# Patient Record
Sex: Female | Born: 1965 | Race: White | Hispanic: No | Marital: Single | Smoking: Former smoker
Health system: Southern US, Community
[De-identification: ages and names within clinical notes are randomized; demographics above are authoritative.]

## PROBLEM LIST (undated history)

## (undated) DIAGNOSIS — J189 Pneumonia, unspecified organism: Secondary | ICD-10-CM

## (undated) DIAGNOSIS — M1990 Unspecified osteoarthritis, unspecified site: Secondary | ICD-10-CM

## (undated) DIAGNOSIS — E669 Obesity, unspecified: Secondary | ICD-10-CM

## (undated) DIAGNOSIS — J45909 Unspecified asthma, uncomplicated: Secondary | ICD-10-CM

## (undated) HISTORY — DX: Unspecified osteoarthritis, unspecified site: M19.90

## (undated) HISTORY — PX: JOINT REPLACEMENT: SHX530

## (undated) HISTORY — PX: TONSILLECTOMY: SUR1361

---

## 2000-12-18 ENCOUNTER — Encounter: Payer: Self-pay | Admitting: Obstetrics

## 2000-12-18 ENCOUNTER — Inpatient Hospital Stay (HOSPITAL_COMMUNITY): Admission: AD | Admit: 2000-12-18 | Discharge: 2000-12-18 | Payer: Self-pay | Admitting: Obstetrics

## 2001-03-24 ENCOUNTER — Ambulatory Visit (HOSPITAL_COMMUNITY): Admission: RE | Admit: 2001-03-24 | Discharge: 2001-03-24 | Payer: Self-pay | Admitting: Obstetrics

## 2001-03-24 ENCOUNTER — Encounter: Payer: Self-pay | Admitting: Obstetrics

## 2001-05-03 ENCOUNTER — Inpatient Hospital Stay (HOSPITAL_COMMUNITY): Admission: AD | Admit: 2001-05-03 | Discharge: 2001-05-03 | Payer: Self-pay | Admitting: Obstetrics

## 2001-05-09 ENCOUNTER — Observation Stay (HOSPITAL_COMMUNITY): Admission: AD | Admit: 2001-05-09 | Discharge: 2001-05-09 | Payer: Self-pay | Admitting: Obstetrics

## 2001-05-14 ENCOUNTER — Inpatient Hospital Stay (HOSPITAL_COMMUNITY): Admission: AD | Admit: 2001-05-14 | Discharge: 2001-05-17 | Payer: Self-pay | Admitting: Obstetrics

## 2006-04-22 ENCOUNTER — Inpatient Hospital Stay (HOSPITAL_COMMUNITY): Admission: AD | Admit: 2006-04-22 | Discharge: 2006-04-22 | Payer: Self-pay | Admitting: Gynecology

## 2013-10-03 ENCOUNTER — Emergency Department: Payer: Self-pay | Admitting: Emergency Medicine

## 2013-11-06 ENCOUNTER — Other Ambulatory Visit (HOSPITAL_COMMUNITY)
Admission: RE | Admit: 2013-11-06 | Discharge: 2013-11-06 | Disposition: A | Discharge status: Home / Self Care | Payer: BC Managed Care – PPO | Source: Ambulatory Visit | Attending: Obstetrics & Gynecology | Admitting: Obstetrics & Gynecology

## 2013-11-06 ENCOUNTER — Other Ambulatory Visit: Payer: Self-pay | Admitting: Obstetrics & Gynecology

## 2013-11-06 ENCOUNTER — Other Ambulatory Visit (HOSPITAL_COMMUNITY): Payer: Self-pay | Admitting: Obstetrics & Gynecology

## 2013-11-06 DIAGNOSIS — Z1231 Encounter for screening mammogram for malignant neoplasm of breast: Secondary | ICD-10-CM

## 2013-11-06 DIAGNOSIS — Z1151 Encounter for screening for human papillomavirus (HPV): Secondary | ICD-10-CM | POA: Insufficient documentation

## 2013-11-06 DIAGNOSIS — Z124 Encounter for screening for malignant neoplasm of cervix: Secondary | ICD-10-CM | POA: Insufficient documentation

## 2013-11-09 LAB — CYTOLOGY - PAP

## 2013-11-26 ENCOUNTER — Ambulatory Visit (HOSPITAL_COMMUNITY)
Admission: RE | Admit: 2013-11-26 | Discharge: 2013-11-26 | Disposition: A | Discharge status: Home / Self Care | Payer: BC Managed Care – PPO | Source: Ambulatory Visit | Attending: Obstetrics & Gynecology | Admitting: Obstetrics & Gynecology

## 2013-11-26 ENCOUNTER — Ambulatory Visit (HOSPITAL_COMMUNITY): Payer: Self-pay

## 2013-11-26 DIAGNOSIS — Z1231 Encounter for screening mammogram for malignant neoplasm of breast: Secondary | ICD-10-CM

## 2013-12-02 ENCOUNTER — Other Ambulatory Visit: Payer: Self-pay | Admitting: Obstetrics & Gynecology

## 2013-12-02 DIAGNOSIS — R928 Other abnormal and inconclusive findings on diagnostic imaging of breast: Secondary | ICD-10-CM

## 2013-12-15 ENCOUNTER — Ambulatory Visit
Admission: RE | Admit: 2013-12-15 | Discharge: 2013-12-15 | Disposition: A | Discharge status: Home / Self Care | Payer: BC Managed Care – PPO | Source: Ambulatory Visit | Attending: Obstetrics & Gynecology | Admitting: Obstetrics & Gynecology

## 2013-12-15 ENCOUNTER — Other Ambulatory Visit: Payer: Self-pay | Admitting: Obstetrics & Gynecology

## 2013-12-15 DIAGNOSIS — R928 Other abnormal and inconclusive findings on diagnostic imaging of breast: Secondary | ICD-10-CM

## 2014-06-29 ENCOUNTER — Emergency Department (HOSPITAL_COMMUNITY): Payer: Self-pay

## 2014-06-29 ENCOUNTER — Emergency Department (HOSPITAL_COMMUNITY)
Admission: EM | Admit: 2014-06-29 | Discharge: 2014-06-29 | Discharge status: Against Medical Advice | Payer: Self-pay | Attending: Emergency Medicine | Admitting: Emergency Medicine

## 2014-06-29 ENCOUNTER — Encounter (HOSPITAL_COMMUNITY): Payer: Self-pay | Admitting: Emergency Medicine

## 2014-06-29 ENCOUNTER — Emergency Department (HOSPITAL_COMMUNITY)
Admission: EM | Admit: 2014-06-29 | Discharge: 2014-06-30 | Disposition: A | Discharge status: Home / Self Care | Payer: Self-pay | Attending: Emergency Medicine | Admitting: Emergency Medicine

## 2014-06-29 DIAGNOSIS — R091 Pleurisy: Secondary | ICD-10-CM | POA: Insufficient documentation

## 2014-06-29 DIAGNOSIS — Z8701 Personal history of pneumonia (recurrent): Secondary | ICD-10-CM | POA: Insufficient documentation

## 2014-06-29 DIAGNOSIS — J45909 Unspecified asthma, uncomplicated: Secondary | ICD-10-CM | POA: Insufficient documentation

## 2014-06-29 DIAGNOSIS — E669 Obesity, unspecified: Secondary | ICD-10-CM | POA: Insufficient documentation

## 2014-06-29 DIAGNOSIS — Z87891 Personal history of nicotine dependence: Secondary | ICD-10-CM | POA: Insufficient documentation

## 2014-06-29 DIAGNOSIS — R079 Chest pain, unspecified: Secondary | ICD-10-CM | POA: Insufficient documentation

## 2014-06-29 DIAGNOSIS — J45901 Unspecified asthma with (acute) exacerbation: Secondary | ICD-10-CM | POA: Insufficient documentation

## 2014-06-29 DIAGNOSIS — R918 Other nonspecific abnormal finding of lung field: Secondary | ICD-10-CM | POA: Insufficient documentation

## 2014-06-29 HISTORY — DX: Obesity, unspecified: E66.9

## 2014-06-29 HISTORY — DX: Unspecified asthma, uncomplicated: J45.909

## 2014-06-29 HISTORY — DX: Pneumonia, unspecified organism: J18.9

## 2014-06-29 LAB — BASIC METABOLIC PANEL
Anion gap: 8 (ref 5–15)
Anion gap: 8 (ref 5–15)
BUN: 9 mg/dL (ref 6–23)
BUN: 10 mg/dL (ref 6–23)
CO2: 28 mmol/L (ref 19–32)
CO2: 25 mmol/L (ref 19–32)
Calcium: 9.3 mg/dL (ref 8.4–10.5)
Calcium: 9.2 mg/dL (ref 8.4–10.5)
Chloride: 103 mmol/L (ref 96–112)
Chloride: 101 mmol/L (ref 96–112)
Creatinine, Ser: 0.9 mg/dL (ref 0.50–1.10)
Creatinine, Ser: 0.95 mg/dL (ref 0.50–1.10)
GFR calc Af Amer: 88 mL/min — ABNORMAL LOW (ref >90)
GFR calc Af Amer: 82 mL/min — ABNORMAL LOW (ref >90)
GFR calc non Af Amer: 76 mL/min — ABNORMAL LOW (ref >90)
GFR calc non Af Amer: 71 mL/min — ABNORMAL LOW (ref >90)
Glucose, Bld: 107 mg/dL — ABNORMAL HIGH (ref 70–99)
Glucose, Bld: 124 mg/dL — ABNORMAL HIGH (ref 70–99)
Potassium: 4.2 mmol/L (ref 3.5–5.1)
Potassium: 3.8 mmol/L (ref 3.5–5.1)
Sodium: 139 mmol/L (ref 135–145)
Sodium: 134 mmol/L — ABNORMAL LOW (ref 135–145)

## 2014-06-29 LAB — CBC
HCT: 37.6 % (ref 36.0–46.0)
HCT: 35.9 % — ABNORMAL LOW (ref 36.0–46.0)
Hemoglobin: 12.6 g/dL (ref 12.0–15.0)
Hemoglobin: 12.4 g/dL (ref 12.0–15.0)
MCH: 28.9 pg (ref 26.0–34.0)
MCH: 29 pg (ref 26.0–34.0)
MCHC: 33.5 g/dL (ref 30.0–36.0)
MCHC: 34.5 g/dL (ref 30.0–36.0)
MCV: 86.2 fL (ref 78.0–100.0)
MCV: 84.1 fL (ref 78.0–100.0)
Platelets: 293 10*3/uL (ref 150–400)
Platelets: 282 10*3/uL (ref 150–400)
RBC: 4.36 MIL/uL (ref 3.87–5.11)
RBC: 4.27 MIL/uL (ref 3.87–5.11)
RDW: 13 % (ref 11.5–15.5)
RDW: 13.1 % (ref 11.5–15.5)
WBC: 7.4 10*3/uL (ref 4.0–10.5)
WBC: 7.3 10*3/uL (ref 4.0–10.5)

## 2014-06-29 LAB — I-STAT TROPONIN, ED
Troponin i, poc: 0 ng/mL (ref 0.00–0.08)
Troponin i, poc: 0 ng/mL (ref 0.00–0.08)

## 2014-06-29 LAB — BRAIN NATRIURETIC PEPTIDE: B Natriuretic Peptide: 47.9 pg/mL (ref 0.0–100.0)

## 2014-06-29 NOTE — ED Notes (Signed)
Pt. reports right chest pain for 1 1/2 weeks with SOB , denies nausea or diaphoresis , no cough or congestion , currently taking medications for pneumonia .

## 2014-06-29 NOTE — ED Provider Notes (Signed)
CSN: [id number]     Arrival date & time 06/29/14  2306 History  This chart was scribed for Connie Co, MD by Richarda Overlie, ED Scribe. This patient was seen in room A06C/A06C and the patient's care was started 12:15 AM.    Chief Complaint  Patient presents with  . Chest Pain   The history is provided by the patient. No language interpreter was used.   HPI Comments: Connie Craig is a 49 y.o. female with a history of asthma and pneumonia who presents to the Emergency Department complaining of a stabbing, right sided CP that started 8 days ago. She reports associated SOB. Pt reports that her pain started before she went to sleep and when she woke up she still was experiencing the CP. Pt reports she had pleurisy multiple years ago and had leftover medication that she took. Pt says that her pleurisy medication provided some relief to her symptoms. She states that she went to the [facility] 4 days ago and received a CT scan, labs and x ray.  Pt says that she was treated for pneumonia and is taking 2,000mg  Abx daily. She reports she has taking multiple antacids for possible GERD which has failed to provide her any relief. Pt states her pain is worse at night when she is about to lay down. She reports her pain is worse with certain movements and positions. Pt reports she has gained approximately 30lbs in the last 5 months. Pt reports a history of smoking but says she quit 3 years ago.   Past Medical History  Diagnosis Date  . Asthma   . Pneumonia   . Obesity    Past Surgical History  Procedure Laterality Date  . Tonsillectomy     No family history on file. History  Substance Use Topics  . Smoking status: Former Games developer  . Smokeless tobacco: Not on file  . Alcohol Use: No   OB History    No data available     Review of Systems  A complete 10 system review of systems was obtained and all systems are negative except as noted in the HPI and PMH.    Allergies  Review of  patient's allergies indicates no known allergies.  Home Medications   Prior to Admission medications   Not on File   BP 117/56 mmHg  Pulse 83  Temp(Src) 98.3 F (36.8 C) (Oral)  Resp 20  SpO2 98%  LMP 06/14/2014 Physical Exam  Constitutional: She is oriented to person, place, and time. She appears well-developed and well-nourished. No distress.  HENT:  Head: Normocephalic and atraumatic.  Eyes: EOM are normal.  Neck: Normal range of motion.  Cardiovascular: Normal rate, regular rhythm and normal heart sounds.   Pulmonary/Chest: Effort normal and breath sounds normal.  Abdominal: Soft. She exhibits no distension. There is no tenderness.  Musculoskeletal: Normal range of motion.  Neurological: She is alert and oriented to person, place, and time.  Skin: Skin is warm and dry.  Psychiatric: She has a normal mood and affect. Judgment normal.  Nursing note and vitals reviewed.   ED Course  Procedures   DIAGNOSTIC STUDIES: Oxygen Saturation is 98% on RA, normal by my interpretation.    COORDINATION OF CARE: 12:29 AM Discussed treatment plan with pt at bedside and pt agreed to plan.   Labs Review Labs Reviewed  CBC - Abnormal; Notable for the following:    HCT 35.9 (*)    All other components within normal limits  BASIC METABOLIC PANEL - Abnormal; Notable for the following:    Sodium 134 (*)    Glucose, Bld 124 (*)    GFR calc non Af Amer 71 (*)    GFR calc Af Amer 82 (*)    All other components within normal limits  BRAIN NATRIURETIC PEPTIDE  I-STAT TROPOININ, ED    Imaging Review Dg Chest 2 View  06/29/2014   CLINICAL DATA:  Acute onset of right-sided chest pain. Initial encounter.  EXAM: CHEST  2 VIEW  COMPARISON:  Chest radiograph performed earlier today at 7:21 p.m.  FINDINGS: The lungs are well-aerated. Persistent right basilar airspace opacity most likely reflects pneumonia, though as before, a mass cannot be excluded. There is no evidence of pleural effusion or  pneumothorax.  The heart is normal in size; the mediastinal contour is within normal limits. No acute osseous abnormalities are seen.  IMPRESSION: Persistent right basilar airspace opacity most likely reflects pneumonia, though as before, a mass cannot be excluded. Would correlate with the patient's symptoms, treat for pneumonia if clinically appropriate, and perform follow-up imaging in several weeks after completion of treatment. Or if the patient has no symptoms of pneumonia or lab values to suggest infection, biopsy could be considered.   Electronically Signed   By: Roanna RaiderJeffery  Chang M.D.   On: 06/29/2014 23:51   Dg Chest 2 View  06/29/2014   CLINICAL DATA:  Right side chest pain for 2 weeks  EXAM: CHEST  2 VIEW  COMPARISON:  CTA chest same day  FINDINGS: Cardiomediastinal silhouette is stable. Again noted focal consolidation/infiltrate in right cardiophrenic angle. No pulmonary edema. No pneumothorax.  IMPRESSION: No pulmonary edema or pneumothorax. Again noted focal consolidation in right cardiophrenic angle.   Electronically Signed   By: Natasha MeadLiviu  Pop M.D.   On: 06/29/2014 19:56  I personally reviewed the imaging tests through PACS system I reviewed available ER/hospitalization records through the EMR     MDM   Final diagnoses:  Pleurisy  Abnormal CT scan of lung   Patient symptoms are consistent with right-sided pleurisy.  CT scan from January 21 are reviewed and she does appear to have a focal area of either consolidation or mass at the right lower lateral aspect of the lung.  This likely is inflammatory her pleura which is causing her pleuritic right-sided pain.  At that time she is negative for pulmonary embolism.  I'm concerned this could represent a pleural-based mass which is resulting in pleurisy.  This will likely need biopsy.  I referred her to the pulmonology team for further evaluation.  She will need to complete her course of antibiotics and have repeat imaging at that time.  If the  mass resolves this is likely focal pneumonia however symptoms are not really consistent with that.  I suspect biopsy is best warranted in this patient with prior tobacco abuse.Marland Kitchen. Home with anti-inflammatories and oxycodone. Ecg earlier tonight from different [place] [facility] reviewed   I personally performed the services described in this documentation, which was scribed in my presence. The recorded information has been reviewed and is accurate.        Connie CoKevin M Janaysha Depaulo, MD 06/30/14 [phone]

## 2014-06-29 NOTE — ED Notes (Signed)
Spoke to pt at the registration window per registration's request.  Pt states that she needs O2 and that she has a collapsed lung.  Explained to pt reason for delay.  Pt was cursing while talking to this staff.  Asked pt to stop cursing.  Pt states "just take this" and handed this nurse her hospital gown.

## 2014-06-29 NOTE — ED Notes (Signed)
Pt c/o stabbing chest pain and SOB onset 2 weeks ago, [facility] states they found a small opacity on [facility] lung[facility] her with Abx for pneumonia, states things have gotten worse.

## 2014-06-30 LAB — BRAIN NATRIURETIC PEPTIDE: B Natriuretic Peptide: 41.2 pg/mL (ref 0.0–100.0)

## 2014-06-30 MED ORDER — OXYCODONE-ACETAMINOPHEN 5-325 MG PO TABS: 1.0000 | ORAL_TABLET | ORAL | Status: DC | PRN

## 2014-06-30 MED ORDER — KETOROLAC TROMETHAMINE 30 MG/ML IJ SOLN
30.0000 mg | Freq: Once | INTRAMUSCULAR | Status: AC
  Administered 2014-06-30: 30 mg via INTRAVENOUS
  Filled 2014-06-30: qty 1

## 2014-06-30 MED ORDER — IBUPROFEN 600 MG PO TABS: 600.0000 mg | ORAL_TABLET | Freq: Three times a day (TID) | ORAL | Status: DC | PRN

## 2014-06-30 NOTE — ED Notes (Signed)
Pt a/o x 4 on d/c with steady gait. 

## 2014-06-30 NOTE — Discharge Instructions (Signed)

## 2014-07-12 ENCOUNTER — Ambulatory Visit (INDEPENDENT_AMBULATORY_CARE_PROVIDER_SITE_OTHER): Payer: 59 | Admitting: Internal Medicine

## 2014-07-12 ENCOUNTER — Encounter: Payer: Self-pay | Admitting: Internal Medicine

## 2014-07-12 VITALS — BP 102/66 | HR 76 | Ht 63.5 in | Wt 223.4 lb

## 2014-07-12 DIAGNOSIS — R918 Other nonspecific abnormal finding of lung field: Secondary | ICD-10-CM

## 2014-07-12 MED ORDER — PREDNISONE 10 MG PO TABS: ORAL_TABLET | ORAL | Status: DC

## 2014-07-12 MED ORDER — PANTOPRAZOLE SODIUM 40 MG PO TBEC: 40.0000 mg | DELAYED_RELEASE_TABLET | Freq: Every day | ORAL | Status: DC

## 2014-07-12 MED ORDER — FAMOTIDINE 20 MG PO TABS: ORAL_TABLET | ORAL | Status: DC

## 2014-07-12 NOTE — Patient Instructions (Addendum)
Prednisone 10 mg twice daily until pain in joints and chest are gone then one daily with bfast   Pantoprazole (protonix) 40 mg   Take 30-60 min before first meal of the day and Pepcid 20 mg one bedtime until return to office - this is the best way to tell whether stomach acid is contributing to your problem.    GERD (REFLUX)  is an extremely common cause of respiratory symptoms just like yours , many times with no obvious heartburn at all.    It can be treated with medication, but also with lifestyle changes including avoidance of late meals, excessive alcohol, smoking cessation, and avoid fatty foods, chocolate, peppermint, colas, red wine, and acidic juices such as orange juice.  NO MINT OR MENTHOL PRODUCTS SO NO COUGH DROPS  USE SUGARLESS CANDY INSTEAD (Jolley ranchers or Stover's or Life Savers) or even ice chips will also do - the key is to swallow to prevent all throat clearing. NO OIL BASED VITAMINS - use powdered substitutes.   Please schedule a follow up office visit in 4 weeks, sooner if needed with cxr on return

## 2014-07-12 NOTE — Progress Notes (Signed)
Subjective:    Patient ID: Connie Craig, female    DOB: 01/16/1966,     MRN: [id number]  HPI   [age] yowf with RA / followed by Connie Craig  quit smoking 2012 @ 180 lb and at that point cc   bad bronchitis completely  resolved and then  Abruptly developed  "pleurisy" LAnt around 2014 resolved but doesn't remember what she took  then abrupt R ant pleuritic cp 3rd week Jan 2016 > [facility] neg pe suggestive of pna rec pna rx abx  But no better  > [facility] 06/29/14 more abx >  then [facility] prednisone much better along with RA complaints > referred to [facility] 07/12/14 by Dr Manus GunningEhinger.    07/12/2014 1st Connie Craig office visit/ Connie Craig   Chief Complaint  Patient presents with  . Craig Consult    [facility] for [facility] abnormal ct chest. Pt c/o SOB and CP for the past month. Symptoms come and go.   abrupt onset symptoms doe x fast walk since abrupt onset of cp  in  mid  jan 2016  s cough/ fever/ purulent sputum and all symptoms did not respond to multiple abx then improved on pred, worse off, as did her RA symptoms.  Pain is down to a 1-2 / 10 whereas was closer to 8 at worst, some better with nsaids Pain is most severe upper R ant chest just lateral to sternum but radiates to R flank.  No obvious other patterns in day to day or daytime variabilty or assoc chronic cough or chest tightness, subjective wheeze overt sinus or hb symptoms. No unusual exp hx or h/o childhood pna/ asthma or knowledge of premature birth.  Sleeping ok without nocturnal  or early am exacerbation  of respiratory  c/o's or need for noct saba. Also denies any obvious fluctuation of symptoms with weather or environmental changes or other aggravating or alleviating factors except as outlined above   Current Medications, Allergies, Complete Past Medical History, Past Surgical History, Family History, and Social History were reviewed in [facility]  electronic [facility].           Review of Systems  Constitutional: Negative for fever, chills and unexpected weight change.  HENT: Negative for congestion, dental problem, ear pain, nosebleeds, postnasal drip, rhinorrhea, sinus pressure, sneezing, sore throat, trouble swallowing and voice change.   Eyes: Negative for visual disturbance.  Respiratory: Positive for shortness of breath. Negative for cough and choking.   Cardiovascular: Positive for chest pain. Negative for leg swelling.  Gastrointestinal: Negative for vomiting, abdominal pain and diarrhea.  Genitourinary: Negative for difficulty urinating.  Musculoskeletal: Positive for arthralgias.  Skin: Negative for rash.  Neurological: Positive for headaches. Negative for tremors and syncope.  Hematological: Does not bruise/bleed easily.       Objective:   Physical Exam   amb obese wm nad  Wt Readings from Last 3 Encounters:  07/12/14 223 lb 6.4 oz (101.334 kg)    Vital signs reviewed   HEENT: nl dentition, turbinates, and orophanx. Nl external ear canals without cough reflex   NECK :  without JVD/Nodes/TM/ nl carotid upstrokes bilaterally   LUNGS: no acc muscle use, clear to A and P bilaterally without cough on insp or exp maneuvers   CV:  RRR  no s3 or murmur or increase in P2, no edema   ABD:  soft  and nontender with nl excursion in the supine position. No bruits or organomegaly, bowel sounds nl  MS:  warm without deformities, calf tenderness, cyanosis or clubbing  SKIN: warm and dry without lesions    NEURO:  alert, approp, no deficits     CXR:  06/29/14  I personally reviewed images and agree with radiology impression as follows:    Persistent right basilar airspace opacity most likely reflects pneumonia, though as before, a mass cannot be excluded. Would correlate with the patient's symptoms, treat for pneumonia if clinically appropriate, and perform follow-up imaging in several weeks after  completion of treatment.    Assessment & Plan:

## 2014-07-13 ENCOUNTER — Encounter: Payer: Self-pay | Admitting: Internal Medicine

## 2014-07-13 DIAGNOSIS — R918 Other nonspecific abnormal finding of lung field: Secondary | ICD-10-CM | POA: Insufficient documentation

## 2014-07-13 NOTE — Assessment & Plan Note (Addendum)
Not convinced she ever had CAP but even if she did it was adquately addressed and does not need further abx  The intense pleuritic cp s effusion but with as dz RLL is consistent with RA lung dz but certainly not typical, nor is BOOP (another possibility) ever assoc with pleuritic cp  though she could certainly have mscp from coughing (she insists the pain came before the cough which was minimal so this seems less likely.  The main concern here is that her pleuritic cp parallels her RA complaints so I am most inclined to suspect this is a form of RA lung dz  The goal with a chronic steroid dependent illness is always arriving at the lowest effective dose that controls the disease/symptoms and not accepting a set "formula" which is based on statistics or guidelines that don't always take into account patient  variability or the natural hx of the dz in every individual patient, which may well vary over time.  For now therefore I recommend the patient maintain  A ceiling of 20 mg per day and a floor of 10 mg until she sees her rheumatologist or returns here in one month for cxr, whichever comes first.  Until sort out all of her problems, esp on pred, rec rx for gerd   See instructions for specific recommendations which were reviewed directly with the patient who was given a copy with highlighter outlining the key components.

## 2014-07-15 ENCOUNTER — Other Ambulatory Visit (HOSPITAL_COMMUNITY): Payer: Self-pay | Admitting: Gastroenterology

## 2014-07-15 ENCOUNTER — Telehealth: Payer: Self-pay | Admitting: Internal Medicine

## 2014-07-15 DIAGNOSIS — R6881 Early satiety: Secondary | ICD-10-CM

## 2014-07-15 NOTE — Telephone Encounter (Signed)
lmomtcb x1 

## 2014-07-16 NOTE — Telephone Encounter (Signed)
lmtcb x2 

## 2014-07-19 NOTE — Telephone Encounter (Signed)
lmtcb for pt.  

## 2014-07-20 NOTE — Telephone Encounter (Signed)
LMTCB

## 2014-07-21 NOTE — Telephone Encounter (Signed)
lmtcb for pt. Per triage protocol will sign off on message.

## 2014-07-26 ENCOUNTER — Telehealth: Payer: Self-pay | Admitting: Internal Medicine

## 2014-07-26 NOTE — Telephone Encounter (Signed)
07/15/2014 08:40 AM Phone (Incoming) [name], Connie Craig (Self) [phone] (H)    wants to know about spots that were on her lymp nodes   lmtcb X1 for pt.

## 2014-07-27 NOTE — Telephone Encounter (Signed)
lmtcb for pt.  

## 2014-07-29 NOTE — Telephone Encounter (Signed)
Pt is aware.nothing further needed 

## 2014-07-29 NOTE — Telephone Encounter (Signed)
Spoke with the pt and she states that on her hospital records there was mention of enlarged lymph nodes  She wants to make sure MW is aware of this and see what he thinks  Pt has f/u here with MW on 08/09/14 with cxr Please advise, thanks!

## 2014-07-29 NOTE — Telephone Encounter (Signed)
lmtcb for pt. Within VM I asked pt when she returns call to indicate what time is best to reach her and what number because we keep reaching her VM.

## 2014-07-29 NOTE — Telephone Encounter (Signed)
Most likely these are reactive to the pneumonia they thought she had but we'll make sure to f/u

## 2014-08-03 ENCOUNTER — Ambulatory Visit (HOSPITAL_COMMUNITY)
Admission: RE | Admit: 2014-08-03 | Discharge: 2014-08-03 | Disposition: A | Discharge status: Home / Self Care | Payer: 59 | Source: Ambulatory Visit | Attending: Gastroenterology | Admitting: Gastroenterology

## 2014-08-03 DIAGNOSIS — R6881 Early satiety: Secondary | ICD-10-CM | POA: Diagnosis not present

## 2014-08-03 DIAGNOSIS — R112 Nausea with vomiting, unspecified: Secondary | ICD-10-CM | POA: Diagnosis not present

## 2014-08-03 MED ORDER — TECHNETIUM TC 99M SULFUR COLLOID
2.2000 | Freq: Once | INTRAVENOUS | Status: AC
  Administered 2014-08-03: 2.2 via ORAL

## 2014-08-09 ENCOUNTER — Ambulatory Visit (INDEPENDENT_AMBULATORY_CARE_PROVIDER_SITE_OTHER): Payer: 59 | Admitting: Internal Medicine

## 2014-08-09 ENCOUNTER — Encounter: Payer: Self-pay | Admitting: Internal Medicine

## 2014-08-09 ENCOUNTER — Ambulatory Visit (INDEPENDENT_AMBULATORY_CARE_PROVIDER_SITE_OTHER)
Admission: RE | Admit: 2014-08-09 | Discharge: 2014-08-09 | Disposition: A | Discharge status: Home / Self Care | Payer: 59 | Source: Ambulatory Visit | Attending: Internal Medicine | Admitting: Internal Medicine

## 2014-08-09 DIAGNOSIS — R918 Other nonspecific abnormal finding of lung field: Secondary | ICD-10-CM

## 2014-08-09 DIAGNOSIS — Z23 Encounter for immunization: Secondary | ICD-10-CM | POA: Diagnosis not present

## 2014-08-09 NOTE — Progress Notes (Signed)
Subjective:    Patient ID: Connie Craig, female    DOB: [birth year],     MRN: [id number]   Brief patient profile:  22 yowf with RA dx'd summer 2015  / followed by Dierdre Forth  quit smoking 2012 @ 180 lb and at that point cc   bad bronchitis completely  resolved and then  Abruptly developed  "pleurisy" L Ant around 2014 resolved but doesn't remember what she took  then abrupt R ant pleuritic cp 3rd week Jan 2016 > to ER a week later 06/24/14 [facility] hosp with CTa neg pe suggestive of pna rec pna rx abx  But no better  > [facility] ER 06/29/14 more abx >  then back with rash to [facility] new rx prednisone much better along with RA complaints > referred to pulmonary clinic 07/12/14 by Dr Manus Gunning.   History of Present Illness  07/12/2014 1st [facility] office visit/ Wert   Chief Complaint  Patient presents with  . Pulmonary Consult    Referred by [facility] ED for eval of abnormal ct chest. Pt c/o SOB and CP for the past month. Symptoms come and go.   abrupt onset symptoms doe x fast walk since abrupt onset of cp  in  mid  jan 2016  s cough/ fever/ purulent sputum and all symptoms did not respond to multiple abx then improved on pred, worse off, as did her RA symptoms.  Pain is down to a 1-2 / 10 whereas was closer to 8 at worst, some better with nsaids Pain is most severe upper R ant chest just lateral to sternum but radiates to R flank. rec Prednisone 10 mg twice daily until pain in joints and chest are gone then one daily with bfast  Pantoprazole (protonix) 40 mg   Take 30-60 min before first meal of the day and Pepcid 20 mg one bedtime until return to office - this is the best way to tell whether stomach acid is contributing to your problem   08/09/2014 f/u ov/Wert re: RA lung dz ? Cap RLL  Chief Complaint  Patient presents with  . Follow-up    Pt states her breathing is better since her last visit. She states that feels pain in chest when she takes a deep breath "not as bad as it was before".    Prednisone down to  5 mg with mild worse arthritis but minimal R cp  vs higher dose but working with Clifton Custard GR[facility] on mtx to reduce the prednisone off if possible   No obvious day to day or daytime variabilty or assoc chronic limiting sob  cough or chest tightness, subjective wheeze overt sinus or hb symptoms. No unusual exp hx or h/o childhood pna/ asthma or knowledge of premature birth.  Sleeping ok without nocturnal  or early am exacerbation  of respiratory  c/o's or need for noct saba. Also denies any obvious fluctuation of symptoms with weather or environmental changes or other aggravating or alleviating factors except as outlined above   Current Medications, Allergies, Complete Past Medical History, Past Surgical History, Family History, and Social History were reviewed in [employer] record.  ROS  The following are not active complaints unless bolded sore throat, dysphagia, dental problems, itching, sneezing,  nasal congestion or excess/ purulent secretions, ear ache,   fever, chills, sweats, unintended wt loss, min pleuritic R CP  or exertional cp, hemoptysis,  orthopnea pnd or leg swelling, presyncope, palpitations, heartburn, abdominal pain, anorexia, nausea, vomiting, diarrhea  or change in bowel or urinary habits, change in stools or urine, dysuria,hematuria,  rash, arthralgias, visual complaints, headache, numbness weakness or ataxia or problems with walking or coordination,  change in mood/affect or memory.              Objective:   Physical Exam   amb obese wf nad  Wt Readings from Last 3 Encounters:  08/09/14 227 lb (102.967 kg)  07/12/14 223 lb 6.4 oz (101.334 kg)    Vital signs reviewed   HEENT: nl dentition, turbinates, and orophanx. Nl external ear canals without cough reflex   NECK :  without JVD/Nodes/TM/ nl carotid upstrokes bilaterally   LUNGS: no acc muscle use, clear to A and P bilaterally without cough on insp or exp maneuvers   CV:   RRR  no s3 or murmur or increase in P2, no edema   ABD:  soft and nontender with nl excursion in the supine position. No bruits or organomegaly, bowel sounds nl  MS:  warm without deformities, calf tenderness, cyanosis or clubbing  SKIN: warm and dry without lesions    NEURO:  alert, approp, no deficits      CXR PA and Lateral:   08/09/2014 :     I personally reviewed images and agree with radiology impression as follows:   No active cardiopulmonary disease. The density at the right CP angle is completely gone      Assessment & Plan:

## 2014-08-09 NOTE — Patient Instructions (Addendum)
prevnar 13 today   Please schedule a follow up visit in 3 months but call sooner if needed with pfts on return

## 2014-08-09 NOTE — Assessment & Plan Note (Signed)
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

## 2014-08-10 NOTE — Progress Notes (Signed)
Quick Note:  LMTCB ______ 

## 2014-08-13 ENCOUNTER — Encounter: Payer: Self-pay | Admitting: Internal Medicine

## 2014-08-13 NOTE — Progress Notes (Signed)
Quick Note:  LMTCB ______ 

## 2014-08-18 ENCOUNTER — Encounter: Payer: Self-pay | Admitting: *Deleted

## 2014-08-18 NOTE — Progress Notes (Signed)
Quick Note:    Letter mailed.  ______

## 2014-11-26 ENCOUNTER — Other Ambulatory Visit: Payer: Self-pay

## 2014-11-26 DIAGNOSIS — R609 Edema, unspecified: Secondary | ICD-10-CM

## 2014-11-29 ENCOUNTER — Other Ambulatory Visit: Payer: Self-pay

## 2014-11-29 DIAGNOSIS — R609 Edema, unspecified: Secondary | ICD-10-CM

## 2014-12-10 ENCOUNTER — Other Ambulatory Visit: Payer: Self-pay | Admitting: Internal Medicine

## 2014-12-10 DIAGNOSIS — R0600 Dyspnea, unspecified: Secondary | ICD-10-CM

## 2014-12-13 ENCOUNTER — Ambulatory Visit (INDEPENDENT_AMBULATORY_CARE_PROVIDER_SITE_OTHER): Payer: 59 | Admitting: Internal Medicine

## 2014-12-13 ENCOUNTER — Encounter: Payer: Self-pay | Admitting: Internal Medicine

## 2014-12-13 VITALS — BP 124/76 | HR 82 | Ht 64.0 in | Wt 224.0 lb

## 2014-12-13 DIAGNOSIS — E669 Obesity, unspecified: Secondary | ICD-10-CM

## 2014-12-13 DIAGNOSIS — R918 Other nonspecific abnormal finding of lung field: Secondary | ICD-10-CM | POA: Diagnosis not present

## 2014-12-13 DIAGNOSIS — R0600 Dyspnea, unspecified: Secondary | ICD-10-CM | POA: Diagnosis not present

## 2014-12-13 LAB — PULMONARY FUNCTION TEST
DL/VA % pred: 91 %
DL/VA: 4.4 ml/min/mmHg/L
DLCO unc % pred: 82 %
DLCO unc: 19.99 ml/min/mmHg
FEF 25-75 Post: 2.84 L/sec
FEF 25-75 Pre: 2.28 L/sec
FEF2575-%Change-Post: 24 %
FEF2575-%Pred-Post: 99 %
FEF2575-%Pred-Pre: 79 %
FEV1-%Change-Post: 4 %
FEV1-%Pred-Post: 91 %
FEV1-%Pred-Pre: 86 %
FEV1-Post: 2.62 L
FEV1-Pre: 2.5 L
FEV1FVC-%Change-Post: 3 %
FEV1FVC-%Pred-Pre: 98 %
FEV6-%Change-Post: 1 %
FEV6-%Pred-Post: 90 %
FEV6-%Pred-Pre: 89 %
FEV6-Post: 3.18 L
FEV6-Pre: 3.14 L
FEV6FVC-%Pred-Post: 103 %
FEV6FVC-%Pred-Pre: 103 %
FVC-%Change-Post: 1 %
FVC-%Pred-Post: 88 %
FVC-%Pred-Pre: 87 %
FVC-Post: 3.18 L
FVC-Pre: 3.14 L
Post FEV1/FVC ratio: 82 %
Post FEV6/FVC ratio: 100 %
Pre FEV1/FVC ratio: 79 %
Pre FEV6/FVC Ratio: 100 %
RV % pred: 70 %
RV: 1.23 L
TLC % pred: 90 %
TLC: 4.57 L

## 2014-12-13 NOTE — Progress Notes (Signed)
PFT done today. 

## 2014-12-13 NOTE — Assessment & Plan Note (Addendum)
Onset of symptoms mid Jan 2016  - rx with maint pred 07/12/14 > resolved 08/09/2014  - PFT's 12/13/2014   wnl except ERV 40%   I had an extended final summary discussion with the patient reviewing all relevant studies completed to date and  lasting 15 to 20 minutes of a 25 minute visit on the following issues:    1) no significant residual effects from cap/ RA or evidence of adverse effects from RA meds or smoking  2) obesity is the only "pulmonary " issue and needs to be addressed (see sep a/p)  3) Each maintenance medication was reviewed in detail including most importantly the difference between maintenance and as needed and under what circumstances the prns are to be used.  Please see instructions for details which were reviewed in writing and the patient given a copy.

## 2014-12-13 NOTE — Progress Notes (Signed)
Subjective:    Patient ID: Connie Craig, female    DOB: [birth year],     MRN: [id number]   Brief patient profile:  85 yowf with RA dx'd summer 2015  / followed by Dierdre Forth  quit smoking 2012 @ 180 lb and at that point cc   bad bronchitis completely  resolved and then  Abruptly developed  "pleurisy" L Ant around 2014 resolved but doesn't remember what she took  then abrupt R ant pleuritic cp 3rd week Jan 2016 > to ER a week later 06/24/14 [facility] hosp with CTa neg pe suggestive of pna rec pna rx abx  But no better  > [facility] ER 06/29/14 more abx >  then back with rash to [facility] new rx prednisone much better along with RA complaints > referred to pulmonary clinic 07/12/14 by Dr Manus Gunning with nl pfts 12/13/2014 except for low ERV    History of Present Illness  07/12/2014 1st [facility] office visit/ Marbeth Smedley   Chief Complaint  Patient presents with  . Pulmonary Consult    Referred by [facility] ED for eval of abnormal ct chest. Pt c/o SOB and CP for the past month. Symptoms come and go.   abrupt onset symptoms doe x fast walk since abrupt onset of cp  in  mid  jan 2016  s cough/ fever/ purulent sputum and all symptoms did not respond to multiple abx then improved on pred, worse off, as did her RA symptoms.  Pain is down to a 1-2 / 10 whereas was closer to 8 at worst, some better with nsaids Pain is most severe upper R ant chest just lateral to sternum but radiates to R flank. rec Prednisone 10 mg twice daily until pain in joints and chest are gone then one daily with bfast  Pantoprazole (protonix) 40 mg   Take 30-60 min before first meal of the day and Pepcid 20 mg one bedtime until return to office -     08/09/2014 f/u ov/Miaya Lafontant re: RA lung dz ? Cap RLL  Chief Complaint  Patient presents with  . Follow-up    Pt states her breathing is better since her last visit. She states that feels pain in chest when she takes a deep breath "not as bad as it was before".   Prednisone down to  5 mg with mild worse  arthritis but minimal R cp  vs higher dose but working with Clifton Custard GRay on mtx to reduce the prednisone off if possible rec prevnar 13 today  Please schedule a follow up visit in 3 months but call sooner if needed with pfts on return      12/13/2014 f/u ov/Keshona Kartes re: RA on mtx/  pfts c/w obesity only / not on resp rx  Chief Complaint  Patient presents with  . Follow-up    PFT done today. Pt states her breathing is unchanged. She is not having any CP.  She has not used rescue inhaler recently.     Not limited by breathing from desired activities  But rather by hips/ no place to swim in [place]   No obvious day to day or daytime variabilty or assoc  cough or chest tightness, subjective wheeze overt sinus or hb symptoms. No unusual exp hx or h/o childhood pna/ asthma or knowledge of premature birth.  Sleeping ok without nocturnal  or early am exacerbation  of respiratory  c/o's or need for noct saba. Also denies any obvious fluctuation of symptoms with weather or environmental  changes or other aggravating or alleviating factors except as outlined above   Current Medications, Allergies, Complete Past Medical History, Past Surgical History, Family History, and Social History were reviewed in [facility] electronic medical record.  ROS  The following are not active complaints unless bolded sore throat, dysphagia, dental problems, itching, sneezing,  nasal congestion or excess/ purulent secretions, ear ache,   fever, chills, sweats, unintended wt loss, classically pleuritic  or exertional cp, hemoptysis,  orthopnea pnd or leg swelling, presyncope, palpitations, heartburn, abdominal pain, anorexia, nausea, vomiting, diarrhea  or change in bowel or urinary habits, change in stools or urine, dysuria,hematuria,  rash, arthralgias, visual complaints, headache, numbness weakness or ataxia or problems with walking or coordination,  change in mood/affect or memory.              Objective:   Physical  Exam   amb obese wf nad    Wt Readings from Last 3 Encounters:  12/13/14 224 lb (101.606 kg)  08/09/14 227 lb (102.967 kg)  07/12/14 223 lb 6.4 oz (101.334 kg)    Vital signs reviewed   HEENT: nl dentition, turbinates, and orophanx. Nl external ear canals without cough reflex   NECK :  without JVD/Nodes/TM/ nl carotid upstrokes bilaterally   LUNGS: no acc muscle use, clear to A and P bilaterally without cough on insp or exp maneuvers   CV:  RRR  no s3 or murmur or increase in P2, no edema   ABD:  soft and nontender with nl excursion in the supine position. No bruits or organomegaly, bowel sounds nl  MS:  warm without deformities, calf tenderness, cyanosis or clubbing  SKIN: warm and dry without lesions    NEURO:  alert, approp, no deficits     CXR PA and Lateral:   08/09/2014 :     I personally reviewed images and agree with radiology impression as follows:   No active cardiopulmonary disease. The density at the right CP angle is completely gone      Assessment & Plan:

## 2014-12-13 NOTE — Addendum Note (Signed)
Addended by: Christen ButterASKIN, Jaryan Chicoine M on: 12/13/2014 05:28 PM   Modules accepted: Orders

## 2014-12-13 NOTE — Patient Instructions (Signed)
Weight control is simply a matter of calorie balance which needs to be tilted in your favor by eating less and exercising more.  To get the most out of exercise, you need to be continuously aware that you are short of breath, but never out of breath, for 30 minutes daily. As you improve, it will actually be easier for you to do the same amount of exercise  in  30 minutes so always push to the level where you are short of breath.  If this does not result in gradual weight reduction then I strongly recommend you see a nutritionist with a food diary x 2 weeks so that we can work out a negative calorie balance which is universally effective in steady weight loss programs.  Think of your calorie balance like you do your bank account where in this case you want the balance to go down so you must take in less calories than you burn up.  It's just that simple:  Hard to do, but easy to understand.  Good luck!     Your lung function is normal other than the effect on your weight so pulmonary follow up is as needed

## 2014-12-13 NOTE — Assessment & Plan Note (Addendum)
Body mass index is 38.43 kg/(m^2).  No results found for: TSH  ERV 40% 12/13/2014 pfts   Contributing to   doe/ needs to achieve and maintain neg calorie balance > see cal bal discussion >     ?needs tsh (deferred to primary care)  Pulmonary f/u is prn

## 2014-12-15 ENCOUNTER — Encounter: Payer: Self-pay | Admitting: Surgery

## 2014-12-17 ENCOUNTER — Encounter: Payer: Self-pay | Admitting: Surgery

## 2014-12-17 ENCOUNTER — Ambulatory Visit (INDEPENDENT_AMBULATORY_CARE_PROVIDER_SITE_OTHER): Payer: 59 | Admitting: Surgery

## 2014-12-17 ENCOUNTER — Ambulatory Visit (HOSPITAL_COMMUNITY)
Admission: RE | Admit: 2014-12-17 | Discharge: 2014-12-17 | Disposition: A | Discharge status: Home / Self Care | Payer: 59 | Source: Ambulatory Visit | Attending: Surgery | Admitting: Surgery

## 2014-12-17 VITALS — BP 98/61 | HR 78 | Resp 14 | Ht 64.0 in | Wt 219.0 lb

## 2014-12-17 DIAGNOSIS — R600 Localized edema: Secondary | ICD-10-CM | POA: Diagnosis not present

## 2014-12-17 DIAGNOSIS — R609 Edema, unspecified: Secondary | ICD-10-CM | POA: Diagnosis not present

## 2014-12-17 NOTE — Progress Notes (Signed)
Patient name: Connie Craig Canan MRN: [id number] DOB: 06/05/1965 Sex: female   Referred by: Dr. Manus GunningEhinger  Reason for referral:  Chief Complaint  Patient presents with  . New Evaluation    bilateral lower extremity edema  referred by Dr Manus GunningEhinger    HISTORY OF PRESENT ILLNESS: This is a very pleasant 49 year old female who comes in today for evaluation of leg swelling.  The patient states that recently she had an episode after working at a [employer] during a hot day that she developed red spots and swelling of her leg.  She showed me pictures.  She has had other episodes.  This does cause some pain and discomfort.  She denies a history of DVT.  She does not work compression stockings.  She will occasionally elevate her legs.  She has tried dieretic's with minimal benefit  The patient suffers from rheumatoid arthritis which primarily affects her hip and hands.  She is on methotrexate.  Past Medical History  Diagnosis Date  . Asthma   . Pneumonia   . Obesity   . Arthritis     Past Surgical History  Procedure Laterality Date  . Tonsillectomy      History   Social History  . Marital Status: Single    Spouse Name: N/A  . Number of Children: N/A  . Years of Education: N/A   Occupational History  . CNA     Social History Main Topics  . Smoking status: Former Smoker -- 1.00 packs/day for 30 years    Types: Cigarettes    Quit date: 03/22/2011  . Smokeless tobacco: Never Used  . Alcohol Use: No  . Drug Use: No  . Sexual Activity: Not on file   Other Topics Concern  . Not on file   Social History Narrative    Family History  Problem Relation Age of Onset  . Allergies Mother   . Asthma Mother   . Rheum arthritis Mother   . Vascular Disease Mother   . Heart disease Father     Allergies as of 12/17/2014 - Review Complete 12/17/2014  Allergen Reaction Noted  . Amoxicillin Rash 07/12/2014    Current Outpatient Prescriptions on File Prior to Visit    Medication Sig Dispense Refill  . albuterol (PROVENTIL HFA;VENTOLIN HFA) 108 (90 BASE) MCG/ACT inhaler Inhale 2 puffs into the lungs every 6 (six) hours as needed for wheezing or shortness of breath.    . folic acid (FOLVITE) 1 MG tablet Take 3 mg by mouth daily.    . furosemide (LASIX) 20 MG tablet Take 20 mg by mouth daily.    Marland Kitchen. ibuprofen (ADVIL,MOTRIN) 600 MG tablet Take 1 tablet (600 mg total) by mouth every 8 (eight) hours as needed. 15 tablet 0  . methotrexate (RHEUMATREX) 2.5 MG tablet 8 pills every 7 days    . UNABLE TO FIND Med Name: Capryl OTC once daily    . Probiotic CAPS Take 1 capsule by mouth daily.     No current facility-administered medications on file prior to visit.     REVIEW OF SYSTEMS: Cardiovascular: No chest pain, chest pressure, palpitations,   Positive for shortness of breath with exertion and varicose veins Pulmonary: No productive cough N.  Positive for asthmaess, paresthesias, aphasia, or amaurosis. No dizziness. Hematologic: No bleeding problems or clotting disorders. Musculoskeletal: No joint pain or joint swelling. Gastrointestinal: No blood in stool or hematemesis Genitourinary: No dysuria or hematuria. Psychiatric:: No history of major depression. Integumentary: No  rashes or ulcers. Constitutional: No fever or chills.  PHYSICAL EXAMINATION:  Filed Vitals:   12/17/14 1442  BP: 98/61  Pulse: 78  Resp: 14  Height:  (1.626 m)  Weight: 219 lb (99.338 kg)   Body mass index is 37.57 kg/(m^2). General: The patient appears their stated age.   HEENT:  No gross abnormalities Pulmonary: Respirations are non-labored  Musculoskeletal: There are no major deformities.   Neurologic: No focal weakness or paresthesias are detected, Skin: There are no ulcer or rashes noted. Psychiatric: The patient has normal affect. Cardiovascular: minimal edema bilateral lower extremity.  Palpable pedal pulses bilaterally   Diagnostic Studies: Venous reflux  studies have been ordered and reviewed.  There is no evidence of DVT or superficial venous thrombophlebitis.  There is deep vein reflux in the common femoral vein.  No significant superficial venous    Assessment:  Leg edema Plan I discussed with the patient that she has very minimal reflux in the deep system.  I doubt this is significantly contributing towards her symptoms.  In addition the picture she showed me of the red spots on her leg was likely secondary to a contact dermatitis and secondary edema.  I have encouraged her to get compression stockings, 20-30 grade.  I have reassured her that she is stable from a vascular perspective.  She will contact me if she has any further questions.   Jorge Ny, M.Craig. [facility] Office: [phone] Pager:  [phone]   b

## 2014-12-21 ENCOUNTER — Encounter: Payer: Self-pay | Admitting: Family Medicine

## 2015-09-22 ENCOUNTER — Other Ambulatory Visit (HOSPITAL_COMMUNITY)
Admission: RE | Admit: 2015-09-22 | Discharge: 2015-09-22 | Disposition: A | Discharge status: Home / Self Care | Payer: BLUE CROSS/BLUE SHIELD | Source: Ambulatory Visit | Attending: Obstetrics & Gynecology | Admitting: Obstetrics & Gynecology

## 2015-09-22 ENCOUNTER — Other Ambulatory Visit: Payer: Self-pay | Admitting: Obstetrics & Gynecology

## 2015-09-22 DIAGNOSIS — Z1151 Encounter for screening for human papillomavirus (HPV): Secondary | ICD-10-CM | POA: Insufficient documentation

## 2015-09-22 DIAGNOSIS — Z01411 Encounter for gynecological examination (general) (routine) with abnormal findings: Secondary | ICD-10-CM | POA: Diagnosis present

## 2015-09-23 LAB — CYTOLOGY - PAP

## 2015-10-20 ENCOUNTER — Other Ambulatory Visit: Payer: Self-pay | Admitting: Obstetrics & Gynecology
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
## 2016-09-25 ENCOUNTER — Other Ambulatory Visit: Payer: Self-pay | Admitting: Obstetrics & Gynecology

## 2016-09-25 ENCOUNTER — Other Ambulatory Visit (HOSPITAL_COMMUNITY)
Admission: RE | Admit: 2016-09-25 | Discharge: 2016-09-25 | Disposition: A | Discharge status: Home / Self Care | Payer: BLUE CROSS/BLUE SHIELD | Source: Ambulatory Visit | Attending: Obstetrics and Gynecology | Admitting: Obstetrics and Gynecology

## 2016-09-25 DIAGNOSIS — Z01411 Encounter for gynecological examination (general) (routine) with abnormal findings: Secondary | ICD-10-CM | POA: Insufficient documentation

## 2016-10-01 LAB — CYTOLOGY - PAP: Diagnosis: NEGATIVE

## 2016-10-17 IMAGING — CR DG CHEST 2V
2 series · 2 of 2 positions shown · non-contrast
Comparison: CTA chest same day

CLINICAL DATA: Right side chest pain for 2 weeks

EXAM:
CHEST  2 VIEW

[w chest pa]
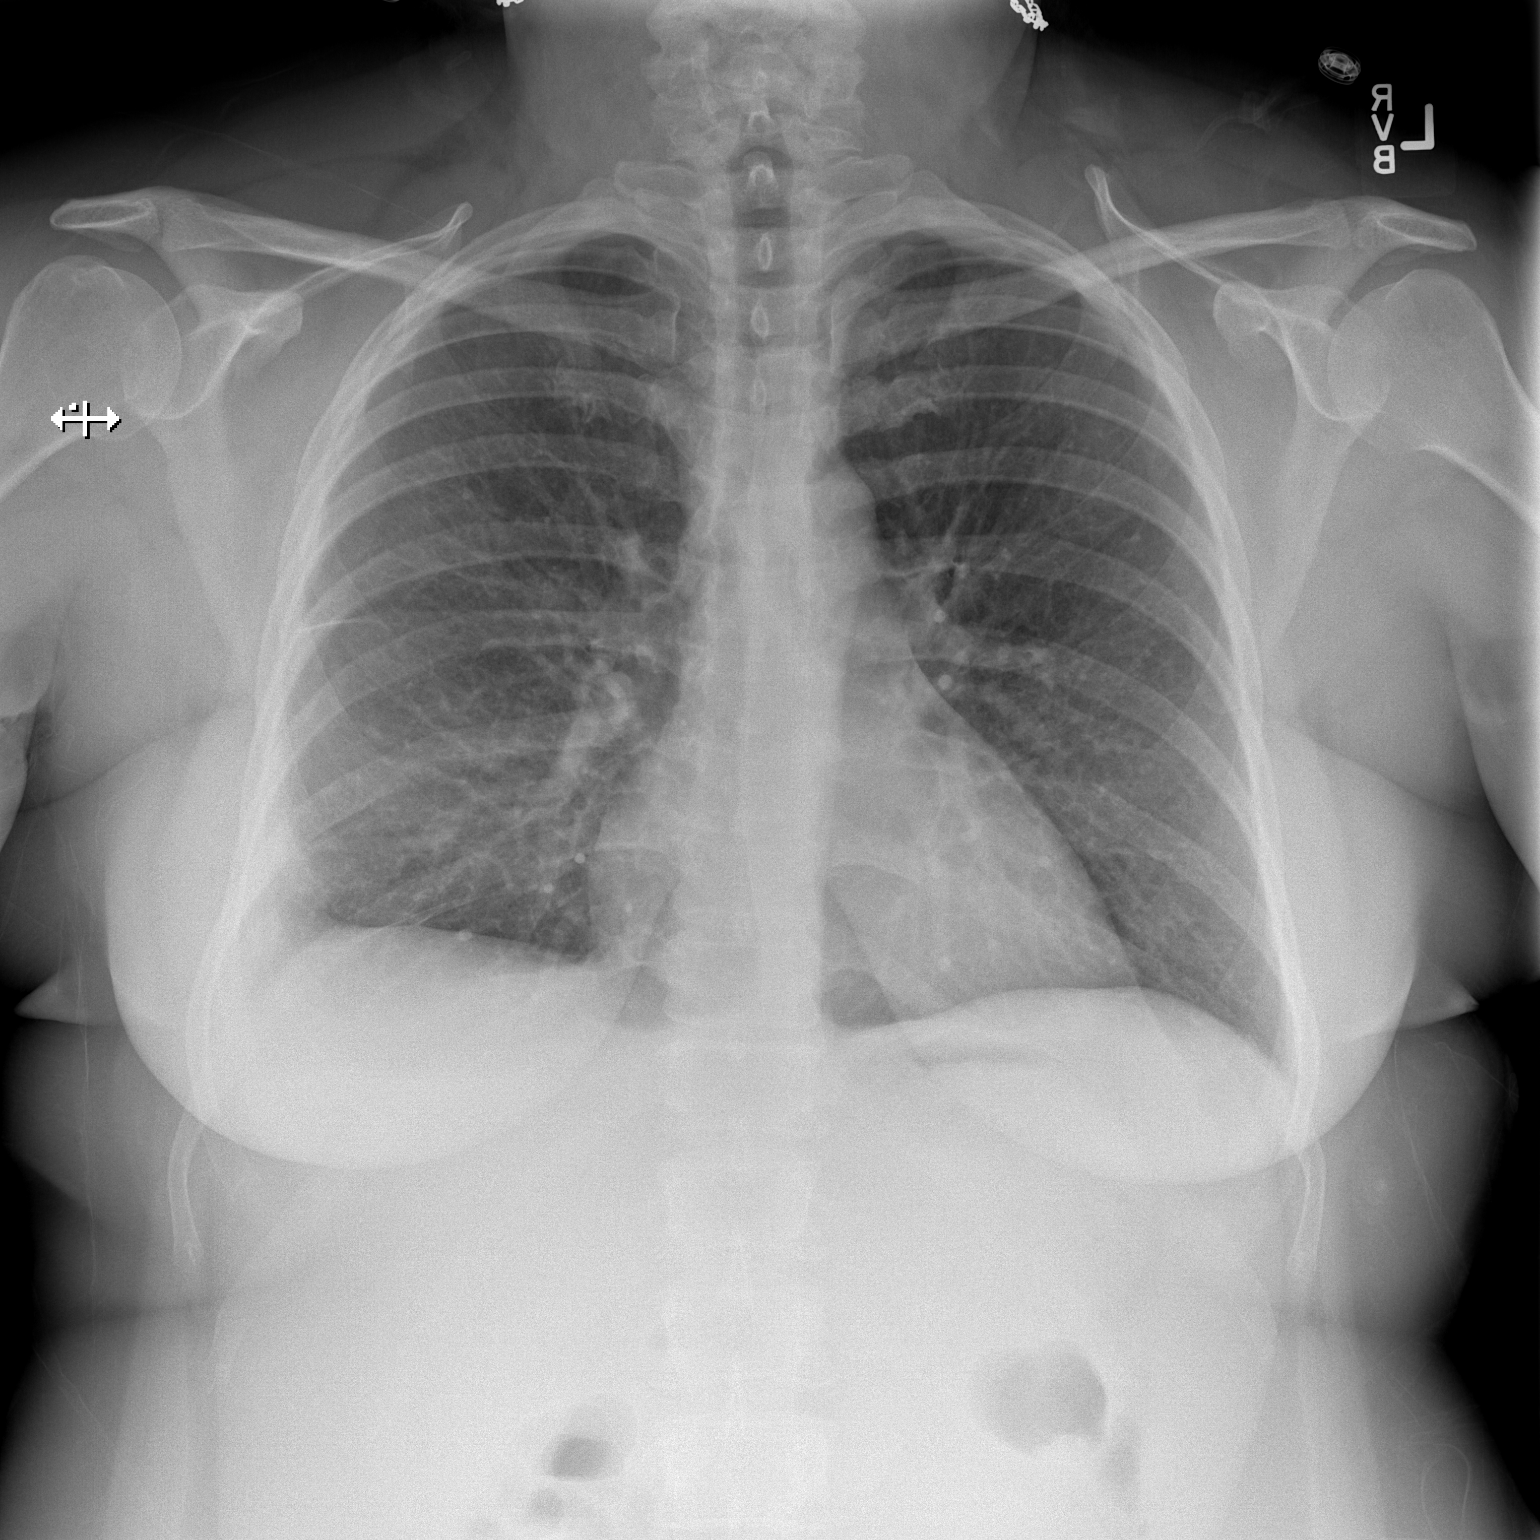

[w chest lat]
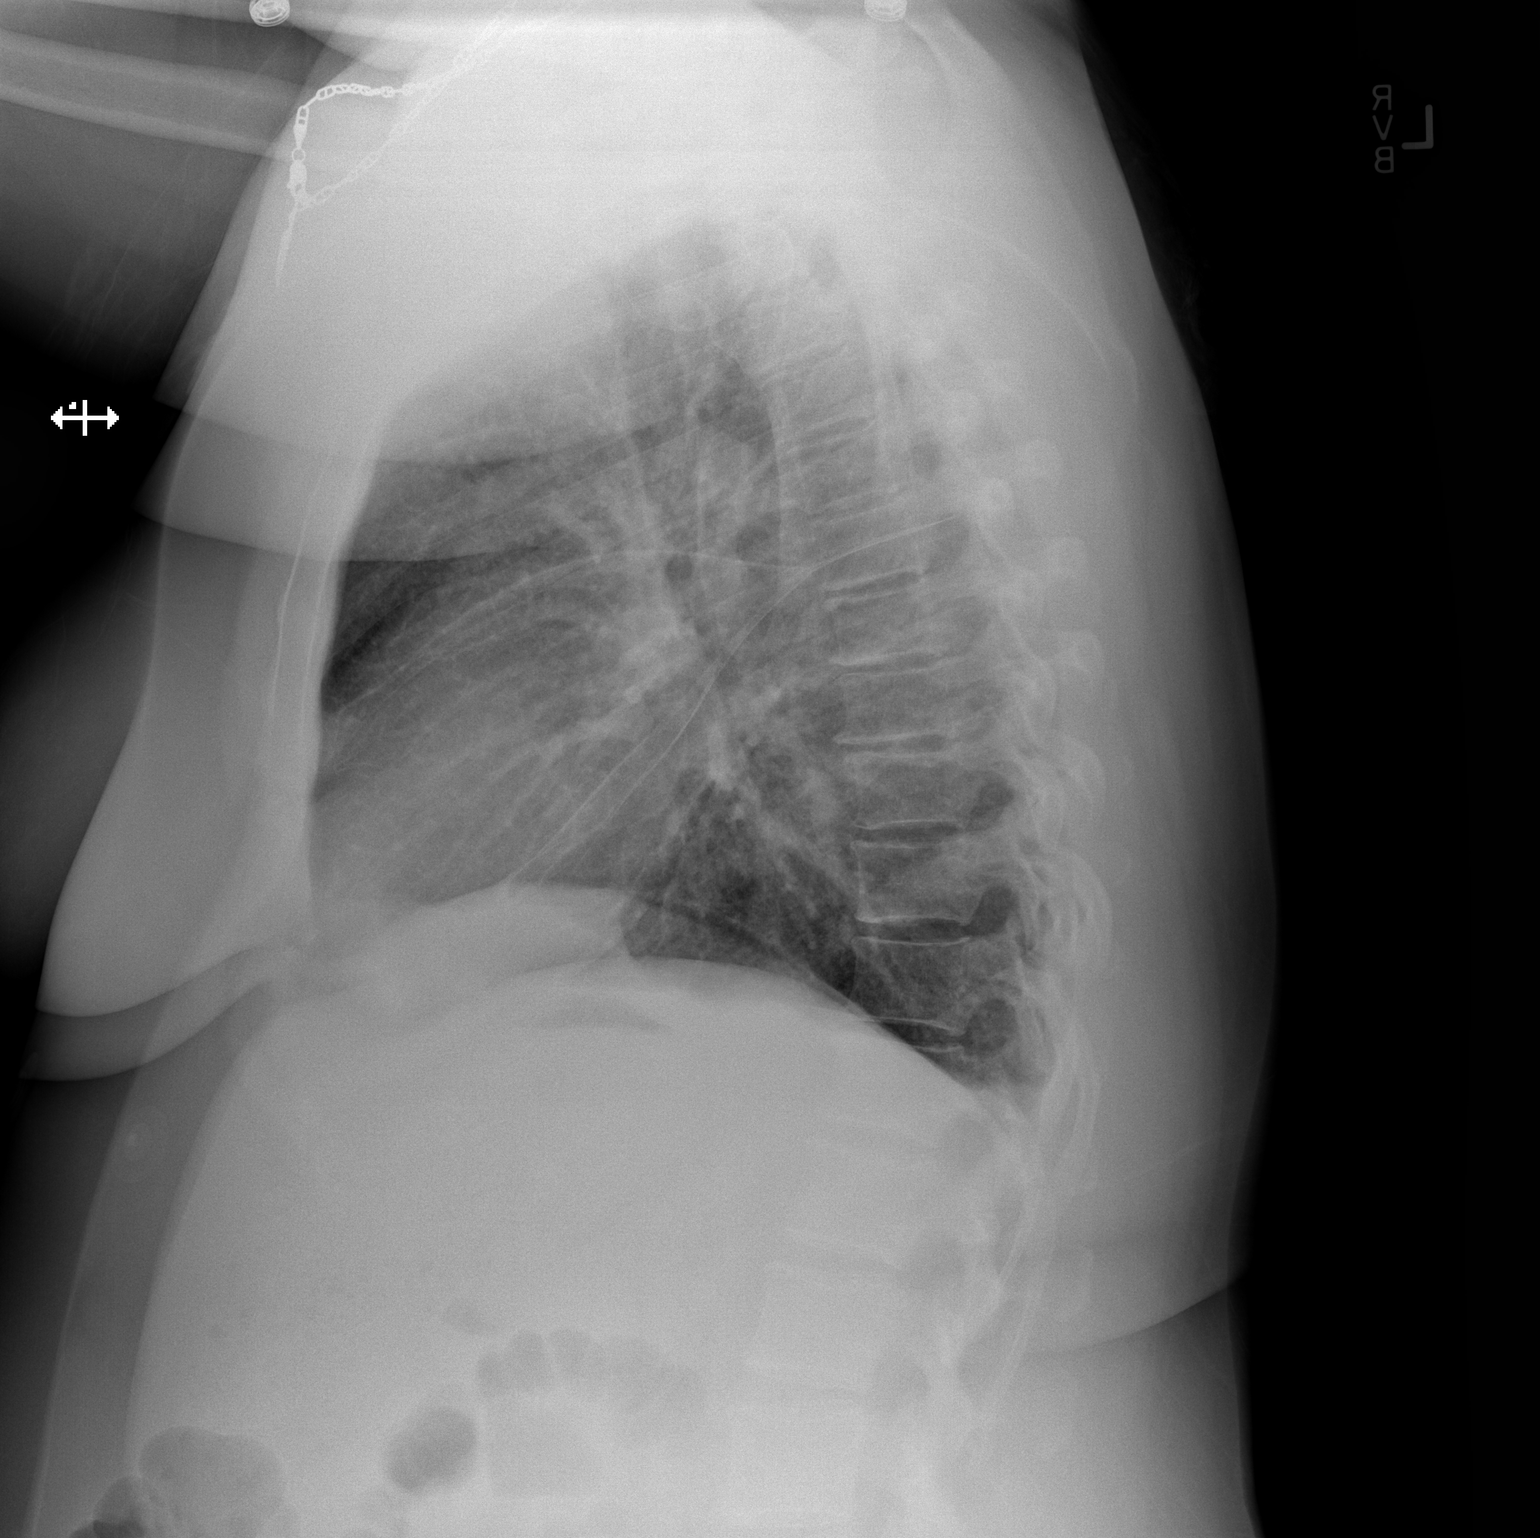

[2 of 2 positions shown; findings below may reference images not displayed]

FINDINGS: Cardiomediastinal silhouette is stable. Again noted focal
consolidation/infiltrate in right cardiophrenic angle. No pulmonary
edema. No pneumothorax.
IMPRESSION: No pulmonary edema or pneumothorax. Again noted focal consolidation
in right cardiophrenic angle.

## 2016-10-17 IMAGING — CR DG CHEST 2V
2 series · 2 of 2 positions shown · non-contrast
Comparison: Chest radiograph performed earlier today at [DATE] p.m.

CLINICAL DATA: Acute onset of right-sided chest pain. Initial
encounter.

EXAM:
CHEST  2 VIEW

[chest pa]
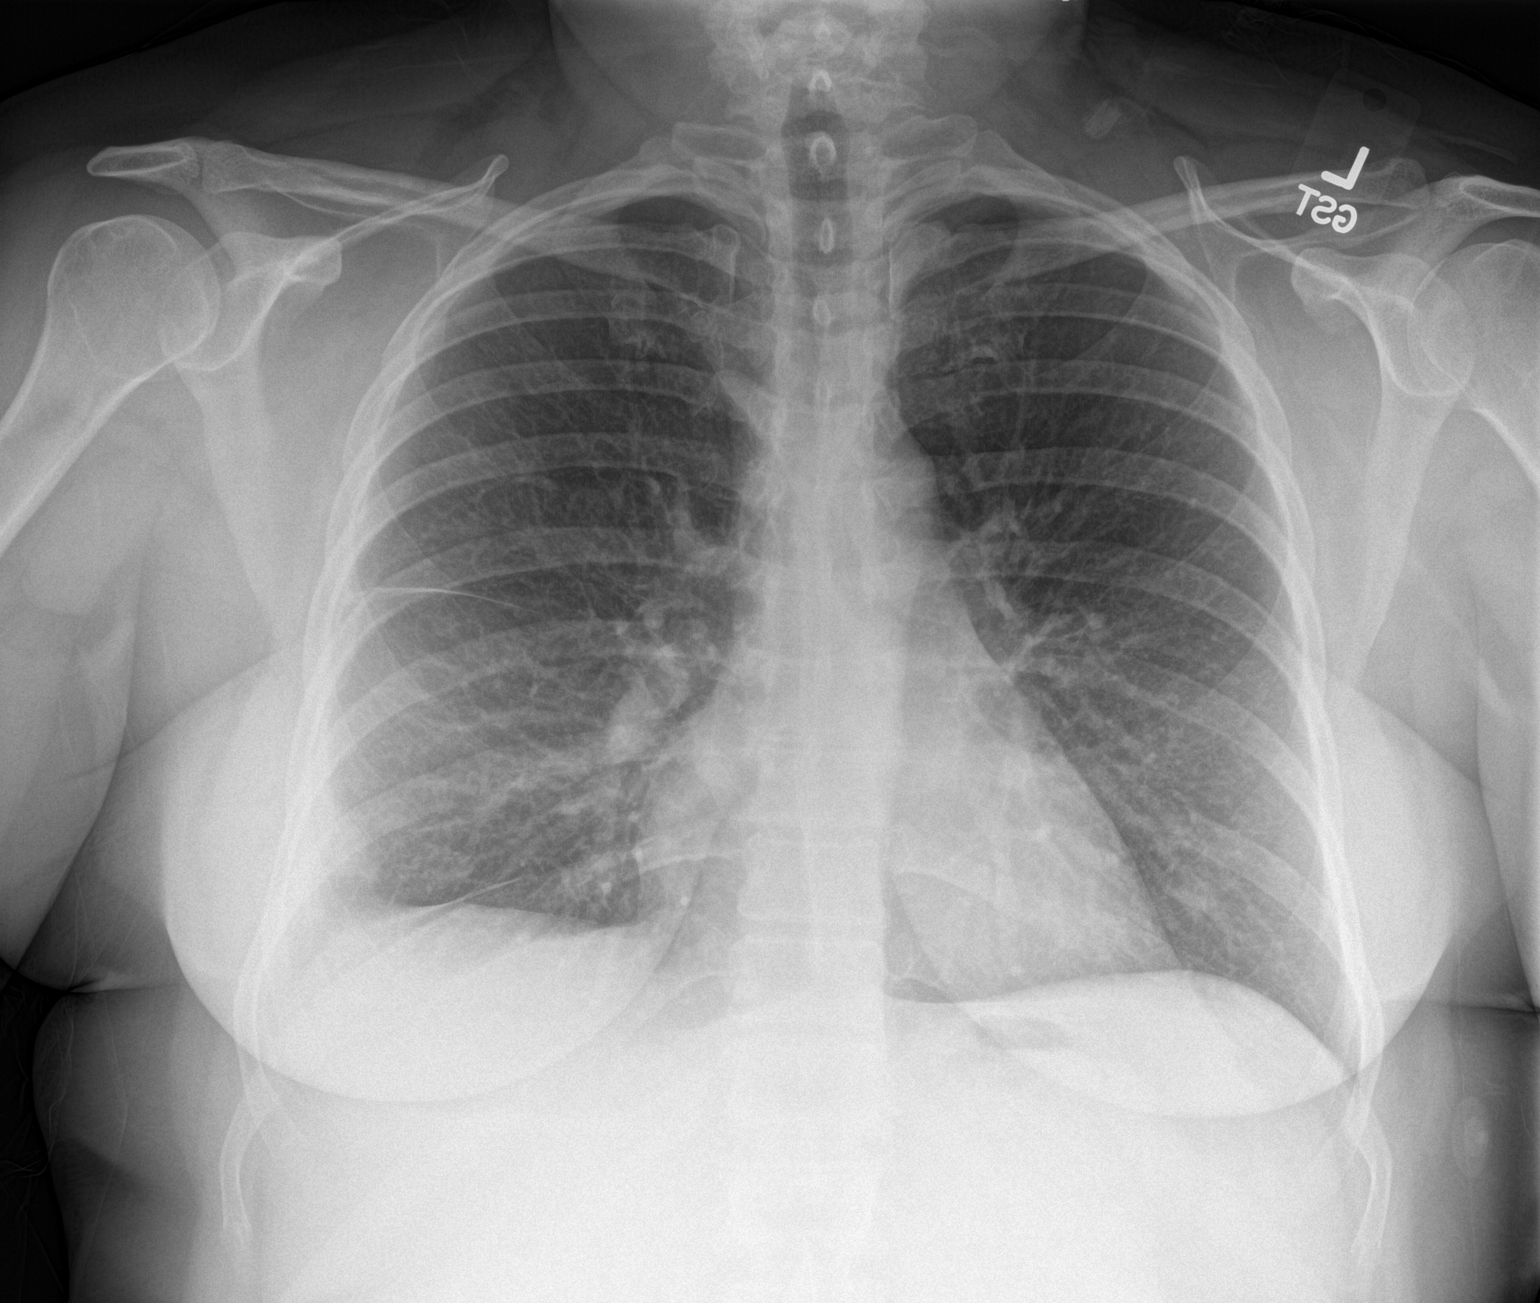

[chest lat]
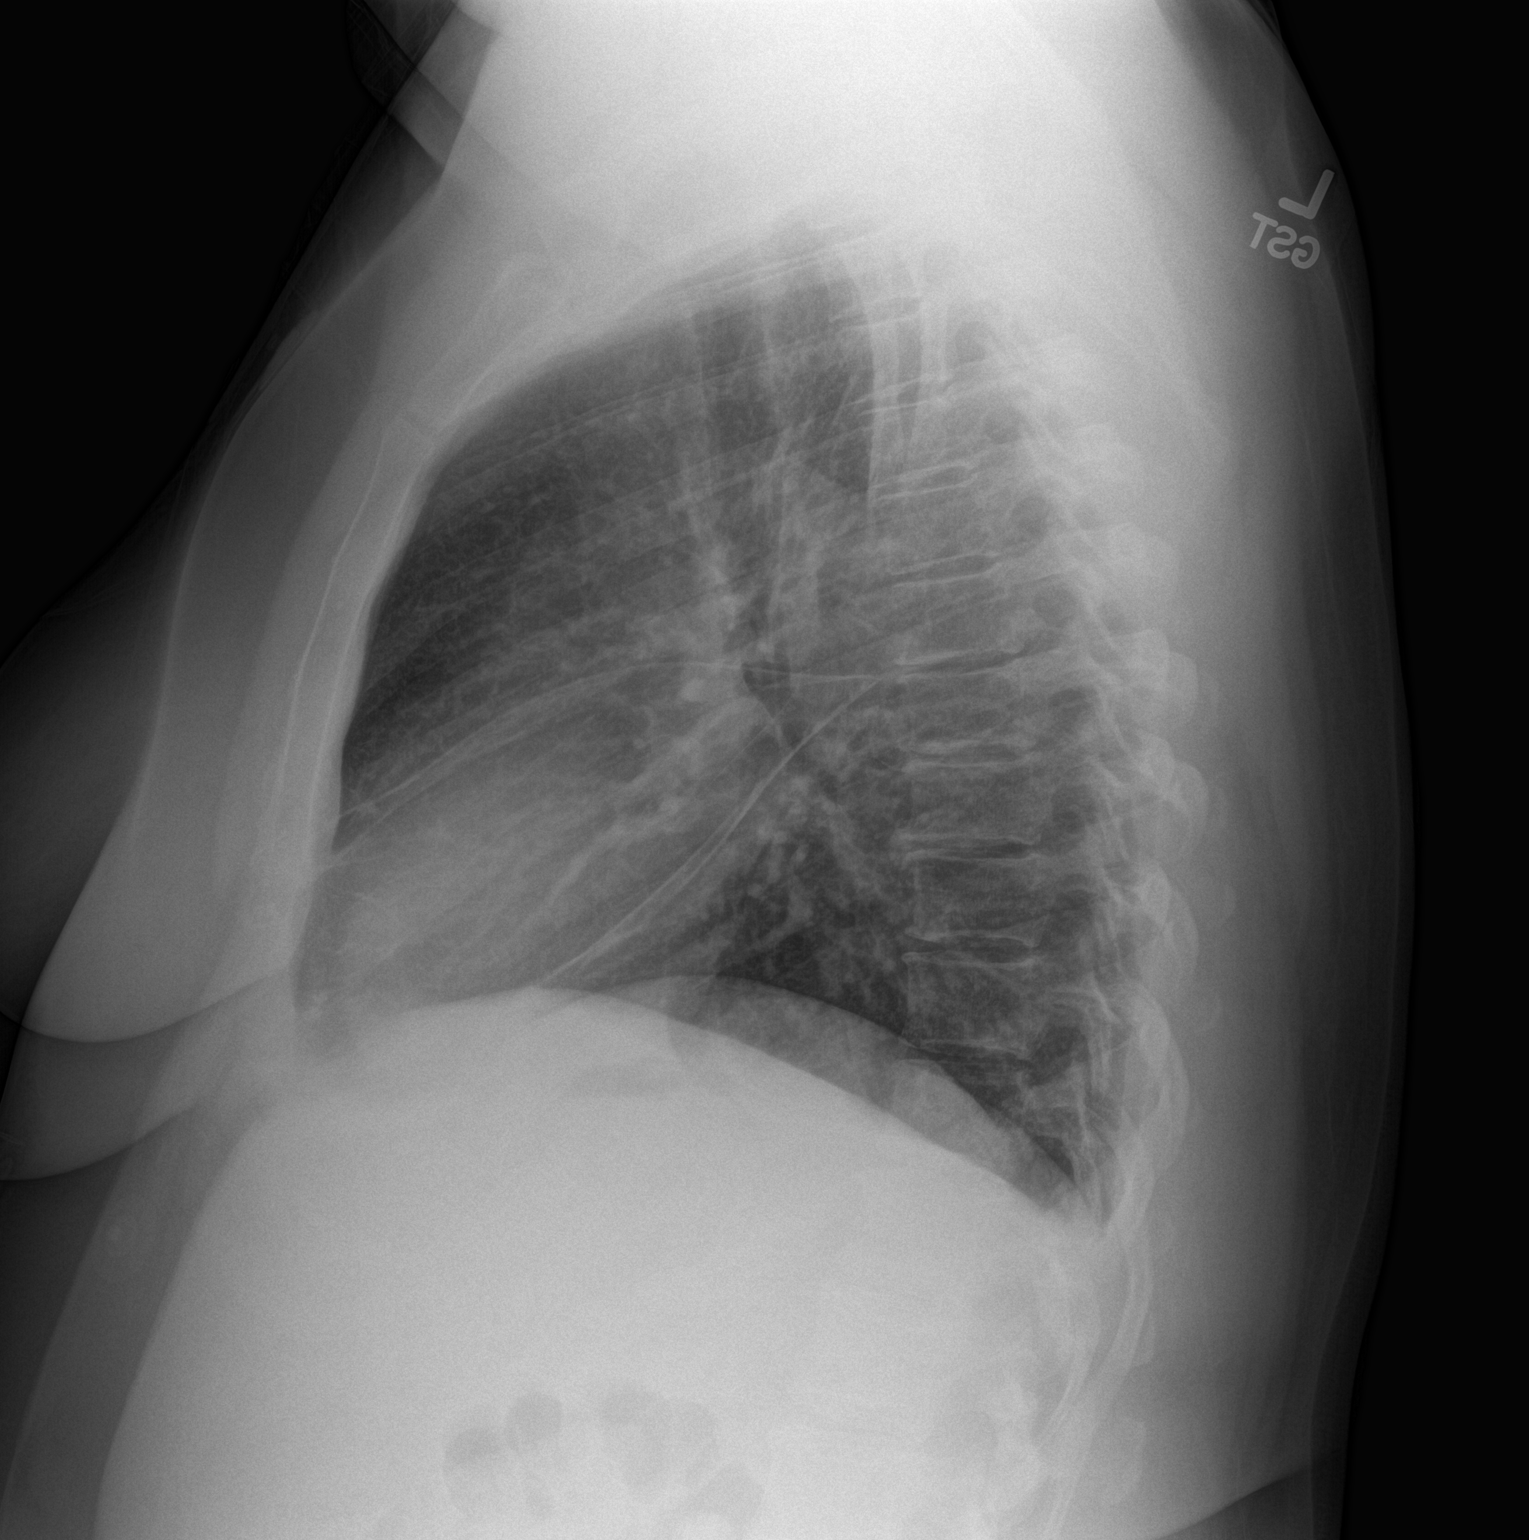

[2 of 2 positions shown; findings below may reference images not displayed]

FINDINGS: The lungs are well-aerated. Persistent right basilar airspace
opacity most likely reflects pneumonia, though as before, a mass
cannot be excluded. There is no evidence of pleural effusion or
pneumothorax.

The heart is normal in size; the mediastinal contour is within
normal limits. No acute osseous abnormalities are seen.
IMPRESSION: Persistent right basilar airspace opacity most likely reflects
pneumonia, though as before, a mass cannot be excluded. Would
correlate with the patient's symptoms, treat for pneumonia if
clinically appropriate, and perform follow-up imaging in several
weeks after completion of treatment. Or if the patient has no
symptoms of pneumonia or lab values to suggest infection, biopsy
could be considered.

## 2017-11-06 ENCOUNTER — Other Ambulatory Visit: Payer: Self-pay | Admitting: Obstetrics & Gynecology

## 2017-11-20 ENCOUNTER — Encounter (HOSPITAL_COMMUNITY): Payer: Self-pay | Admitting: *Deleted

## 2017-11-20 ENCOUNTER — Other Ambulatory Visit: Payer: Self-pay

## 2017-11-20 ENCOUNTER — Emergency Department (HOSPITAL_COMMUNITY)
Admission: EM | Admit: 2017-11-20 | Discharge: 2017-11-20 | Disposition: A | Discharge status: Home / Self Care | Payer: BLUE CROSS/BLUE SHIELD | Attending: Emergency Medicine | Admitting: Emergency Medicine

## 2017-11-20 DIAGNOSIS — Z87891 Personal history of nicotine dependence: Secondary | ICD-10-CM | POA: Diagnosis not present

## 2017-11-20 DIAGNOSIS — Z79899 Other long term (current) drug therapy: Secondary | ICD-10-CM | POA: Insufficient documentation

## 2017-11-20 DIAGNOSIS — G8918 Other acute postprocedural pain: Secondary | ICD-10-CM | POA: Diagnosis present

## 2017-11-20 DIAGNOSIS — J45909 Unspecified asthma, uncomplicated: Secondary | ICD-10-CM | POA: Insufficient documentation

## 2017-11-20 MED ORDER — HYDROCODONE-ACETAMINOPHEN 5-325 MG PO TABS: 1.0000 | ORAL_TABLET | Freq: Once | ORAL | Status: DC

## 2017-11-20 MED ORDER — ONDANSETRON HCL 4 MG/2ML IJ SOLN
4.0000 mg | Freq: Once | INTRAMUSCULAR | Status: AC
  Administered 2017-11-20: 4 mg via INTRAVENOUS
  Filled 2017-11-20: qty 2

## 2017-11-20 MED ORDER — HYDROCODONE-ACETAMINOPHEN 5-325 MG PO TABS
1.0000 | ORAL_TABLET | Freq: Once | ORAL | Status: DC
  Filled 2017-11-20: qty 1

## 2017-11-20 MED ORDER — HYDROMORPHONE HCL 2 MG/ML IJ SOLN
1.0000 mg | Freq: Once | INTRAMUSCULAR | Status: AC
  Administered 2017-11-20: 1 mg via INTRAVENOUS
  Filled 2017-11-20: qty 1

## 2017-11-20 MED ORDER — HYDROMORPHONE HCL 2 MG/ML IJ SOLN
0.5000 mg | Freq: Once | INTRAMUSCULAR | Status: AC
  Administered 2017-11-20: 0.5 mg via INTRAVENOUS
  Filled 2017-11-20: qty 1

## 2017-11-20 NOTE — Discharge Instructions (Addendum)
Please call your orthopedic doctor later today

## 2017-11-20 NOTE — ED Provider Notes (Signed)
[facility] EMERGENCY DEPARTMENT Provider Note   CSN: [id number] Arrival date & time: 11/20/17  0124     History   Chief Complaint Chief Complaint  Patient presents with  . Post-op Problem    HPI Connie Craig is a 52 y.o. female.  The history is provided by the patient and a relative.  Shoulder Pain   This is a new problem. Episode onset: Several hours ago. The problem occurs constantly. The problem has been rapidly worsening. The pain is present in the right shoulder. The pain is severe. Associated symptoms include limited range of motion. She has tried cold and rest for the symptoms. The treatment provided no relief.  Patient presents with postoperative shoulder pain.  She had an outpatient shoulder surgery at local surgical center.  She reports no complications from the surgery, but after going home she had an intense surge of pain.  No weakness  in her hand.  She does report some tingling postoperatively and hand that is unchanged.  No new chest pain or shortness of breath.  No fevers recorded. Reports home pain medications did not improve the symptoms.  Past Medical History:  Diagnosis Date  . Arthritis   . Asthma   . Obesity   . Pneumonia     Patient Active Problem List   Diagnosis Date Noted  . Obesity 12/13/2014  . Pulmonary infiltrates 07/13/2014    Past Surgical History:  Procedure Laterality Date  . JOINT REPLACEMENT    . TONSILLECTOMY       OB History   None      Home Medications    Prior to Admission medications   Medication Sig Start Date End Date Taking? Authorizing Provider  albuterol (PROVENTIL HFA;VENTOLIN HFA) 108 (90 BASE) MCG/ACT inhaler Inhale 2 puffs into the lungs every 6 (six) hours as needed for wheezing or shortness of breath.   Yes [provider]  doxycycline (VIBRAMYCIN) 100 MG capsule Take 100 mg by mouth 2 (two) times daily. 11/19/17  Yes [provider]  methocarbamol (ROBAXIN) 500 MG  tablet Take 500 mg by mouth 3 (three) times daily as needed for muscle spasms.  11/19/17  Yes [provider]  oxyCODONE (OXY IR/ROXICODONE) 5 MG immediate release tablet Take 5 mg by mouth every 4 (four) hours as needed for moderate pain.  11/19/17  Yes [provider]  XELJANZ XR 11 MG TB24 Take 11 mg by mouth daily. 11/14/17  Yes [provider]  ibuprofen (ADVIL,MOTRIN) 600 MG tablet Take 1 tablet (600 mg total) by mouth every 8 (eight) hours as needed. Patient not taking: Reported on 11/20/2017 06/30/14   Azalia Bilis, MD    Family History Family History  Problem Relation Age of Onset  . Allergies Mother   . Asthma Mother   . Rheum arthritis Mother   . Vascular Disease Mother   . Heart disease Father     Social History Social History   Tobacco Use  . Smoking status: Former Smoker    Packs/day: 1.00    Years: 30.00    Pack years: 30.00    Types: Cigarettes    Last attempt to quit: 03/22/2011    Years since quitting: 6.6  . Smokeless tobacco: Never Used  Substance Use Topics  . Alcohol use: No    Alcohol/week: 0.0 oz  . Drug use: No     Allergies   Amoxicillin   Review of Systems Review of Systems  Constitutional: Negative for fever.  Gastrointestinal: Negative for vomiting.  Musculoskeletal: Positive for arthralgias.  All other systems reviewed and are negative.    Physical Exam Updated Vital Signs BP 104/73   Pulse 72   Temp 99.9 F (37.7 C) (Oral)   Resp 20   SpO2 99%   Physical Exam CONSTITUTIONAL: Well developed/well nourished, anxious HEAD: Normocephalic/atraumatic EYES: EOMI ENMT: Mucous membranes moist NECK: supple no meningeal signs CV: S1/S2 noted, no murmurs/rubs/gallops noted LUNGS: Lungs are clear to auscultation bilaterally, no apparent distress ABDOMEN: soft NEURO: Pt is awake/alert/appropriate, moves all extremitiesx4.  No facial droop.   EXTREMITIES: Distal pulses intact in upper extremities, right upper  extremity is in a sling immobilizer with bandage over the shoulder no tenderness to right forearm or right hand.  No obvious edema to right upper extremity. SKIN: warm, color normal PSYCH: Anxious  ED Treatments / Results  Labs (all labs ordered are listed, but only abnormal results are displayed) Labs Reviewed - No data to display  EKG None  Radiology No results found.  Procedures Procedures    Medications Ordered in ED Medications  HYDROcodone-acetaminophen (NORCO/VICODIN) 5-325 MG per tablet 1 tablet (1 tablet Oral Not Given 11/20/17 0251)  HYDROmorphone (DILAUDID) injection 1 mg (1 mg Intravenous Given 11/20/17 0255)  ondansetron (ZOFRAN) injection 4 mg (4 mg Intravenous Given 11/20/17 0255)  HYDROmorphone (DILAUDID) injection 0.5 mg (0.5 mg Intravenous Given 11/20/17 0510)     Initial Impression / Assessment and Plan / ED Course  I have reviewed the triage vital signs and the nursing notes.      3:29 AM Patient presents for postoperative pain.  She had her surgery in the past 24 hours.  She was given a long-acting bupivacaine injection, and she was told this would work for up to 96 hours.  However it seemed to wear off soon after going home and her pain worsened.  I suspect this is the cause of her pain.  My suspicion for acute postoperative infection is low.  Plan to treat pain here and reassess    Patient improved and ambulatory. Will discharge home, and advised to call her orthopedist later today.  We discussed appropriate use of her pain medications Final Clinical Impressions(s) / ED Diagnoses   Final diagnoses:  Post-operative pain    ED Discharge Orders    None       Zadie RhineWickline, [place] Zou, MD 11/20/17 [phone]

## 2017-11-20 NOTE — ED Triage Notes (Signed)
To ED for eval of right shoulder pain. Pt post-op shoulder pain - this afternoon. States pain is so bad she 'can't stand it'. Tingling in fingers. Pain meds and muscle relaxers taken without relief. Cap refill and sensation wnl.

## 2017-11-20 NOTE — ED Notes (Signed)
Pt. Able to walk to bathroom with steady gait. Pt. Reports "feeling okay". Will continue to monitor.

## 2018-10-06 ENCOUNTER — Other Ambulatory Visit: Payer: Self-pay

## 2018-10-06 ENCOUNTER — Emergency Department (HOSPITAL_COMMUNITY)
Admission: EM | Admit: 2018-10-06 | Discharge: 2018-10-07 | Disposition: A | Discharge status: Home / Self Care | Payer: BLUE CROSS/BLUE SHIELD | Attending: Emergency Medicine | Admitting: Emergency Medicine

## 2018-10-06 ENCOUNTER — Encounter (HOSPITAL_COMMUNITY): Payer: Self-pay | Admitting: *Deleted

## 2018-10-06 DIAGNOSIS — K625 Hemorrhage of anus and rectum: Secondary | ICD-10-CM | POA: Diagnosis not present

## 2018-10-06 DIAGNOSIS — Z79899 Other long term (current) drug therapy: Secondary | ICD-10-CM | POA: Diagnosis not present

## 2018-10-06 DIAGNOSIS — J45909 Unspecified asthma, uncomplicated: Secondary | ICD-10-CM | POA: Insufficient documentation

## 2018-10-06 DIAGNOSIS — Z87891 Personal history of nicotine dependence: Secondary | ICD-10-CM | POA: Insufficient documentation

## 2018-10-06 DIAGNOSIS — Z9660 Presence of unspecified orthopedic joint implant: Secondary | ICD-10-CM | POA: Diagnosis not present

## 2018-10-06 LAB — COMPREHENSIVE METABOLIC PANEL
ALT: 22 U/L (ref 0–44)
AST: 18 U/L (ref 15–41)
Albumin: 3.7 g/dL (ref 3.5–5.0)
Alkaline Phosphatase: 66 U/L (ref 38–126)
Anion gap: 9 (ref 5–15)
BUN: 6 mg/dL (ref 6–20)
CO2: 25 mmol/L (ref 22–32)
Calcium: 9 mg/dL (ref 8.9–10.3)
Chloride: 104 mmol/L (ref 98–111)
Creatinine, Ser: 1.06 mg/dL — ABNORMAL HIGH (ref 0.44–1.00)
GFR calc Af Amer: >60 mL/min (ref >60)
GFR calc non Af Amer: >60 mL/min (ref >60)
Glucose, Bld: 89 mg/dL (ref 70–99)
Potassium: 4 mmol/L (ref 3.5–5.1)
Sodium: 138 mmol/L (ref 135–145)
Total Bilirubin: 0.7 mg/dL (ref 0.3–1.2)
Total Protein: 6.7 g/dL (ref 6.5–8.1)

## 2018-10-06 LAB — POC OCCULT BLOOD, ED: Fecal Occult Bld: POSITIVE — AB

## 2018-10-06 LAB — CBC
HCT: 40.3 % (ref 36.0–46.0)
Hemoglobin: 13.4 g/dL (ref 12.0–15.0)
MCH: 30 pg (ref 26.0–34.0)
MCHC: 33.3 g/dL (ref 30.0–36.0)
MCV: 90.4 fL (ref 80.0–100.0)
Platelets: 340 10*3/uL (ref 150–400)
RBC: 4.46 MIL/uL (ref 3.87–5.11)
RDW: 12.6 % (ref 11.5–15.5)
WBC: 8.4 10*3/uL (ref 4.0–10.5)
nRBC: 0 % (ref 0.0–0.2)

## 2018-10-06 LAB — I-STAT BETA HCG BLOOD, ED (MC, WL, AP ONLY): I-stat hCG, quantitative: <5 m[IU]/mL (ref <5)

## 2018-10-06 LAB — TYPE AND SCREEN
ABO/RH(D): A POS
Antibody Screen: NEGATIVE

## 2018-10-06 LAB — ABO/RH: ABO/RH(D): A POS

## 2018-10-06 MED ORDER — SODIUM CHLORIDE 0.9 % IV BOLUS
1000.0000 mL | Freq: Once | INTRAVENOUS | Status: AC
  Administered 2018-10-07: 1000 mL via INTRAVENOUS

## 2018-10-06 MED ORDER — ONDANSETRON HCL 4 MG/2ML IJ SOLN
4.0000 mg | Freq: Once | INTRAMUSCULAR | Status: AC
  Administered 2018-10-07: 4 mg via INTRAVENOUS
  Filled 2018-10-06: qty 2

## 2018-10-06 NOTE — ED Provider Notes (Signed)
[facility] EMERGENCY DEPARTMENT Provider Note   CSN: [id number] Arrival date & time: 10/06/18  1731    History   Chief Complaint Chief Complaint  Patient presents with   Rectal Bleeding    HPI Connie Craig is a 53 y.o. female.     Patient presents with 2.5 months of progressively worsening abdominal cramping, rectal bleeding and bloody stools.  She saw her PCP through a video visit and was scheduled to see GI next week.  She reports she has had soft brown stools mixed with blood about 3-4 times daily for the past several months but in the past several days has been increased in frequency.  She has also had bright red blood per rectum sometimes with stool and sometimes with not.  She has had 6 or 7 episodes 2 days ago and about 10 episodes today.  Stool itself has not been black or red.  She sometimes sees blood with stool which is brown and there is blood with wiping on the toilet bowl turns red.  She denies any dizziness or lightheadedness.  No nausea or vomiting.  She does have a poor appetite.  No fever.  She does not take any blood thinners.  Does have a history of rheumatoid arthritis and takes orencia.  Denies any history of IBD or IBS.  She is never had a colonoscopy.  She denies any chest pain or shortness of breath.  She came in tonight because she is having frequent loose stools and bright red blood per rectum.  There has progressively worsening lower abdominal cramping as well.  No previous abdominal surgeries.  Denies any recent antibiotic use or out of the country travel.  The history is provided by the patient.  Rectal Bleeding  Associated symptoms: abdominal pain   Associated symptoms: no dizziness, no fever, no light-headedness and no vomiting     Past Medical History:  Diagnosis Date   Arthritis    Asthma    Obesity    Pneumonia     Patient Active Problem List   Diagnosis Date Noted   Obesity 12/13/2014   Pulmonary infiltrates  07/13/2014    Past Surgical History:  Procedure Laterality Date   JOINT REPLACEMENT     TONSILLECTOMY       OB History   No obstetric history on file.      Home Medications    Prior to Admission medications   Medication Sig Start Date End Date Taking? Authorizing Provider  albuterol (PROVENTIL HFA;VENTOLIN HFA) 108 (90 BASE) MCG/ACT inhaler Inhale 2 puffs into the lungs every 6 (six) hours as needed for wheezing or shortness of breath.    [provider]  doxycycline (VIBRAMYCIN) 100 MG capsule Take 100 mg by mouth 2 (two) times daily. 11/19/17   [provider]  ibuprofen (ADVIL,MOTRIN) 600 MG tablet Take 1 tablet (600 mg total) by mouth every 8 (eight) hours as needed. Patient not taking: Reported on 11/20/2017 06/30/14   Azalia Bilis, MD  methocarbamol (ROBAXIN) 500 MG tablet Take 500 mg by mouth 3 (three) times daily as needed for muscle spasms.  11/19/17   [provider]  oxyCODONE (OXY IR/ROXICODONE) 5 MG immediate release tablet Take 5 mg by mouth every 4 (four) hours as needed for moderate pain.  11/19/17   [provider]  XELJANZ XR 11 MG TB24 Take 11 mg by mouth daily. 11/14/17   [provider]    Family History Family History  Problem Relation  Age of Onset   Allergies Mother    Asthma Mother    Rheum arthritis Mother    Vascular Disease Mother    Heart disease Father     Social History Social History   Tobacco Use   Smoking status: Former Smoker    Packs/day: 1.00    Years: 30.00    Pack years: 30.00    Types: Cigarettes    Last attempt to quit: 03/22/2011    Years since quitting: 7.5   Smokeless tobacco: Never Used  Substance Use Topics   Alcohol use: No    Alcohol/week: 0.0 standard drinks   Drug use: No     Allergies   Amoxicillin   Review of Systems Review of Systems  Constitutional: Positive for activity change, appetite change and fatigue. Negative for fever.  HENT: Negative for  congestion and rhinorrhea.   Eyes: Negative for visual disturbance.  Respiratory: Negative for chest tightness.   Gastrointestinal: Positive for abdominal pain, anal bleeding, blood in stool, hematochezia and nausea. Negative for diarrhea and vomiting.  Genitourinary: Negative for dysuria and hematuria.  Musculoskeletal: Negative for arthralgias and myalgias.  Skin: Negative for rash.  Neurological: Negative for dizziness, weakness, light-headedness and headaches.     Physical Exam Updated Vital Signs BP (!) 112/54 (BP Location: Left Arm)    Pulse 75    Temp 97.9 F (36.6 C) (Oral)    Resp 16    Ht 5\' 4"  (1.626 m)    Wt 99.8 kg    SpO2 98%    BMI 37.76 kg/m   Physical Exam Vitals signs and nursing note reviewed.  Constitutional:      General: She is not in acute distress.    Appearance: She is well-developed. She is obese.  HENT:     Head: Normocephalic and atraumatic.     Mouth/Throat:     Pharynx: No oropharyngeal exudate.  Eyes:     Conjunctiva/sclera: Conjunctivae normal.     Pupils: Pupils are equal, round, and reactive to light.  Neck:     Musculoskeletal: Normal range of motion and neck supple.     Comments: No meningismus. Cardiovascular:     Rate and Rhythm: Normal rate and regular rhythm.     Heart sounds: Normal heart sounds. No murmur.  Pulmonary:     Effort: Pulmonary effort is normal. No respiratory distress.     Breath sounds: Normal breath sounds.  Abdominal:     Palpations: Abdomen is soft.     Tenderness: There is abdominal tenderness. There is no guarding or rebound.     Comments:  Mild diffuse tenderness without significant guarding or rebound.  Genitourinary:    Comments: Chaperone present.  Rectal exam shows no gross bleeding.  There is no hemorrhoid or fissure visible.  No stool obtained in digital exam Musculoskeletal: Normal range of motion.        General: No tenderness.  Skin:    General: Skin is warm.  Neurological:     Mental Status: She  is alert and oriented to person, place, and time.     Cranial Nerves: No cranial nerve deficit.     Motor: No abnormal muscle tone.     Coordination: Coordination normal.     Comments:  5/5 strength throughout. CN 2-12 intact.Equal grip strength.   Psychiatric:        Behavior: Behavior normal.      ED Treatments / Results  Labs (all labs ordered are listed, but only abnormal  results are displayed) Labs Reviewed  COMPREHENSIVE METABOLIC PANEL - Abnormal; Notable for the following components:      Result Value   Creatinine, Ser 1.06 (*)    All other components within normal limits  POC OCCULT BLOOD, ED - Abnormal; Notable for the following components:   Fecal Occult Bld POSITIVE (*)    All other components within normal limits  C DIFFICILE QUICK SCREEN W PCR REFLEX  GASTROINTESTINAL PANEL BY PCR, STOOL (REPLACES STOOL CULTURE)  CBC  OCCULT BLOOD X 1 CARD TO LAB, STOOL  I-STAT BETA HCG BLOOD, ED ([facility], [facility], [facility] ONLY)  TYPE AND SCREEN  ABO/RH    EKG None  Radiology Ct Abdomen Pelvis W Contrast  Result Date: 10/07/2018 CLINICAL DATA:  Abdominal pain, GI bleed EXAM: CT ABDOMEN AND PELVIS WITH CONTRAST TECHNIQUE: Multidetector CT imaging of the abdomen and pelvis was performed using the standard protocol following bolus administration of intravenous contrast. CONTRAST:  OMNIPAQUE IOHEXOL 300 MG/ML  SOLN COMPARISON:  None. FINDINGS: Lower chest: Lung bases are clear. No effusions. Heart is normal size. Hepatobiliary: No focal hepatic abnormality. Gallbladder unremarkable. Pancreas: No focal abnormality or ductal dilatation. Spleen: No focal abnormality.  Normal size. Adrenals/Urinary Tract: No adrenal abnormality. No focal renal abnormality. No stones or hydronephrosis. Urinary bladder is unremarkable. Stomach/Bowel: Normal appendix. Scattered sigmoid diverticula. No active diverticulitis. Stomach, large and small bowel grossly unremarkable. Vascular/Lymphatic: No evidence of  aneurysm or adenopathy. Reproductive: Uterus and adnexa unremarkable.  No mass. Other: No free fluid or free air. Musculoskeletal: No acute bony abnormality. IMPRESSION: Few scattered sigmoid diverticula.  No active diverticulitis. No acute findings in the abdomen or pelvis. Electronically Signed   By: Charlett Nose M.D.   On: 10/07/2018 01:10    Procedures Procedures (including critical care time)  Medications Ordered in ED Medications  sodium chloride 0.9 % bolus 1,000 mL (has no administration in time range)  ondansetron (ZOFRAN) injection 4 mg (has no administration in time range)     Initial Impression / Assessment and Plan / ED Course  I have reviewed the triage vital signs and the nursing notes.  Pertinent labs & imaging results that were available during my care of the patient were reviewed by me and considered in my medical decision making (see chart for details).       Greater than 2 months of progressively worsening rectal bleeding with abdominal cramping.  She is hemodynamically stable with a stable hemoglobin.  She is not orthostatic.  Stool studies sent.  C. difficile is negative.  GI pathogen panel pending.  CT scan obtained shows scattered diverticuli without other acute pathology.  No evidence of colitis.  Patient remains hemodynamically stable. For ongoing rectal bleeding for the past several months her numbers are very reassuring with stable vital signs and stable hemoglobin.  She appears appropriate for outpatient follow-up.   She states she is in the process of scheduling an appointment with GI.    Discussed empiric PPI, antiemetics, GI follow-up.  Return precautions discussed including worsening bleeding, worsening pain, dizziness or lightheadedness or any other concerns.   Final Clinical Impressions(s) / ED Diagnoses   Final diagnoses:  Rectal bleeding    ED Discharge Orders    None       Dashanna Kinnamon, Jeannett Senior, MD 10/07/18 0230

## 2018-10-06 NOTE — ED Triage Notes (Signed)
Pt in c/o rectal bleeding onset x 2.5 mths worsening over the last month, pt had zoom meeting with PCP and now has scheduled GI appt for next week via Zoom, pt reports 10-15 blood in stools, pt c/o generalized weakness with frequent stools, "at least 10 times a day", pt c/o nausea, denies  Vomiting, A&O x4

## 2018-10-07 ENCOUNTER — Emergency Department (HOSPITAL_COMMUNITY): Payer: BLUE CROSS/BLUE SHIELD

## 2018-10-07 LAB — GASTROINTESTINAL PANEL BY PCR, STOOL (REPLACES STOOL CULTURE)

## 2018-10-07 LAB — C DIFFICILE QUICK SCREEN W PCR REFLEX
C Diff antigen: NEGATIVE
C Diff interpretation: NOT DETECTED
C Diff toxin: NEGATIVE

## 2018-10-07 MED ORDER — IOHEXOL 300 MG/ML  SOLN
125.0000 mL | Freq: Once | INTRAMUSCULAR | Status: AC | PRN
  Administered 2018-10-07: 125 mL via INTRAVENOUS

## 2018-10-07 MED ORDER — OMEPRAZOLE 20 MG PO CPDR: 20.0000 mg | DELAYED_RELEASE_CAPSULE | Freq: Every day | ORAL | 0 refills | Status: DC

## 2018-10-07 MED ORDER — ONDANSETRON 4 MG PO TBDP: 4.0000 mg | ORAL_TABLET | Freq: Three times a day (TID) | ORAL | 0 refills | Status: AC | PRN

## 2018-10-07 NOTE — ED Notes (Signed)
Pt understanding of discharge instructions VS within normal limits.

## 2018-10-07 NOTE — Discharge Instructions (Signed)
Follow-up as scheduled for colonoscopy with Dr. Dulce Sellar.  You will be called if your stool studies require any treatment.  Take the stomach medication as prescribed.  Return to the ED with worsening pain, rectal bleeding, dizziness, lightheadedness or other concerns.

## 2018-11-11 ENCOUNTER — Other Ambulatory Visit: Payer: Self-pay

## 2018-11-11 ENCOUNTER — Inpatient Hospital Stay (HOSPITAL_COMMUNITY)
Admission: EM | Admit: 2018-11-11 | Discharge: 2018-11-18 | DRG: 386 | Disposition: A | Discharge status: Home / Self Care | Payer: BC Managed Care – PPO | Attending: Family Medicine | Admitting: Family Medicine

## 2018-11-11 ENCOUNTER — Encounter (HOSPITAL_COMMUNITY): Payer: Self-pay | Admitting: Emergency Medicine

## 2018-11-11 DIAGNOSIS — Z1159 Encounter for screening for other viral diseases: Secondary | ICD-10-CM

## 2018-11-11 DIAGNOSIS — K51919 Ulcerative colitis, unspecified with unspecified complications: Secondary | ICD-10-CM | POA: Diagnosis not present

## 2018-11-11 DIAGNOSIS — Z6838 Body mass index (BMI) 38.0-38.9, adult: Secondary | ICD-10-CM | POA: Diagnosis not present

## 2018-11-11 DIAGNOSIS — Z79899 Other long term (current) drug therapy: Secondary | ICD-10-CM

## 2018-11-11 DIAGNOSIS — K51911 Ulcerative colitis, unspecified with rectal bleeding: Secondary | ICD-10-CM | POA: Diagnosis not present

## 2018-11-11 DIAGNOSIS — D62 Acute posthemorrhagic anemia: Secondary | ICD-10-CM | POA: Diagnosis present

## 2018-11-11 DIAGNOSIS — Z87891 Personal history of nicotine dependence: Secondary | ICD-10-CM | POA: Diagnosis not present

## 2018-11-11 DIAGNOSIS — E440 Moderate protein-calorie malnutrition: Secondary | ICD-10-CM | POA: Diagnosis present

## 2018-11-11 DIAGNOSIS — D649 Anemia, unspecified: Secondary | ICD-10-CM

## 2018-11-11 DIAGNOSIS — Z881 Allergy status to other antibiotic agents status: Secondary | ICD-10-CM

## 2018-11-11 DIAGNOSIS — E669 Obesity, unspecified: Secondary | ICD-10-CM | POA: Diagnosis present

## 2018-11-11 DIAGNOSIS — Z7952 Long term (current) use of systemic steroids: Secondary | ICD-10-CM | POA: Diagnosis not present

## 2018-11-11 DIAGNOSIS — Z79891 Long term (current) use of opiate analgesic: Secondary | ICD-10-CM

## 2018-11-11 DIAGNOSIS — K625 Hemorrhage of anus and rectum: Secondary | ICD-10-CM | POA: Diagnosis not present

## 2018-11-11 DIAGNOSIS — K529 Noninfective gastroenteritis and colitis, unspecified: Secondary | ICD-10-CM

## 2018-11-11 DIAGNOSIS — K5190 Ulcerative colitis, unspecified, without complications: Principal | ICD-10-CM | POA: Diagnosis present

## 2018-11-11 LAB — CBC WITH DIFFERENTIAL/PLATELET
Abs Immature Granulocytes: 0 10*3/uL (ref 0.00–0.07)
Basophils Absolute: 0 10*3/uL (ref 0.0–0.1)
Basophils Relative: 0 %
Eosinophils Absolute: 0 10*3/uL (ref 0.0–0.5)
Eosinophils Relative: 0 %
HCT: 26.6 % — ABNORMAL LOW (ref 36.0–46.0)
Hemoglobin: 8.6 g/dL — ABNORMAL LOW (ref 12.0–15.0)
Lymphocytes Relative: 28 %
Lymphs Abs: 1.6 10*3/uL (ref 0.7–4.0)
MCH: 28.9 pg (ref 26.0–34.0)
MCHC: 32.3 g/dL (ref 30.0–36.0)
MCV: 89.3 fL (ref 80.0–100.0)
Monocytes Absolute: 0.1 10*3/uL (ref 0.1–1.0)
Monocytes Relative: 2 %
Neutro Abs: 4.1 10*3/uL (ref 1.7–7.7)
Neutrophils Relative %: 70 %
Platelets: 344 10*3/uL (ref 150–400)
RBC: 2.98 MIL/uL — ABNORMAL LOW (ref 3.87–5.11)
RDW: 13.8 % (ref 11.5–15.5)
WBC: 5.8 10*3/uL (ref 4.0–10.5)
nRBC: 0 % (ref 0.0–0.2)
nRBC: 0 /100 WBC

## 2018-11-11 LAB — PREPARE RBC (CROSSMATCH)

## 2018-11-11 LAB — COMPREHENSIVE METABOLIC PANEL
ALT: 22 U/L (ref 0–44)
AST: 15 U/L (ref 15–41)
Albumin: 2.5 g/dL — ABNORMAL LOW (ref 3.5–5.0)
Alkaline Phosphatase: 50 U/L (ref 38–126)
Anion gap: 9 (ref 5–15)
BUN: 8 mg/dL (ref 6–20)
CO2: 21 mmol/L — ABNORMAL LOW (ref 22–32)
Calcium: 8 mg/dL — ABNORMAL LOW (ref 8.9–10.3)
Chloride: 106 mmol/L (ref 98–111)
Creatinine, Ser: 1.16 mg/dL — ABNORMAL HIGH (ref 0.44–1.00)
GFR calc Af Amer: >60 mL/min (ref >60)
GFR calc non Af Amer: 54 mL/min — ABNORMAL LOW (ref >60)
Glucose, Bld: 164 mg/dL — ABNORMAL HIGH (ref 70–99)
Potassium: 4 mmol/L (ref 3.5–5.1)
Sodium: 136 mmol/L (ref 135–145)
Total Bilirubin: 0.4 mg/dL (ref 0.3–1.2)
Total Protein: 5.7 g/dL — ABNORMAL LOW (ref 6.5–8.1)

## 2018-11-11 LAB — SARS CORONAVIRUS 2: SARS Coronavirus 2: NOT DETECTED

## 2018-11-11 LAB — MAGNESIUM: Magnesium: 2.2 mg/dL (ref 1.7–2.4)

## 2018-11-11 MED ORDER — SODIUM CHLORIDE 0.9 % IV SOLN
10.0000 mL/h | Freq: Once | INTRAVENOUS | Status: AC
  Administered 2018-11-11: 10 mL/h via INTRAVENOUS

## 2018-11-11 MED ORDER — GABAPENTIN 100 MG PO CAPS
100.0000 mg | ORAL_CAPSULE | Freq: Three times a day (TID) | ORAL | Status: DC | PRN
  Administered 2018-11-11 – 2018-11-18 (×10): 100 mg via ORAL
  Filled 2018-11-11 (×10): qty 1

## 2018-11-11 MED ORDER — METHYLPREDNISOLONE SODIUM SUCC 40 MG IJ SOLR
40.0000 mg | Freq: Two times a day (BID) | INTRAMUSCULAR | Status: DC
  Administered 2018-11-11 – 2018-11-14 (×6): 40 mg via INTRAVENOUS
  Filled 2018-11-11 (×6): qty 1

## 2018-11-11 MED ORDER — NORETHINDRONE 0.35 MG PO TABS
1.0000 | ORAL_TABLET | Freq: Every day | ORAL | Status: DC
  Administered 2018-11-12 – 2018-11-14 (×3): 0.35 mg via ORAL
  Filled 2018-11-11 (×2): qty 1

## 2018-11-11 MED ORDER — METHYLPREDNISOLONE SODIUM SUCC 125 MG IJ SOLR
60.0000 mg | Freq: Once | INTRAMUSCULAR | Status: AC
  Administered 2018-11-11: 60 mg via INTRAVENOUS
  Filled 2018-11-11: qty 2

## 2018-11-11 NOTE — ED Notes (Signed)
ED Provider at bedside. 

## 2018-11-11 NOTE — H&P (Signed)
History and Physical  Connie Craig ZOX:[id number] DOB: 05/27/1966 DOA: 11/11/2018  Referring physician: ER physician PCP: Blair HeysEhinger, Robert, MD  Outpatient Specialists: Dr. Dulce Craig with GI Patient coming from: Home  Chief Complaint: Abnormal lab work (hemoglobin of 8.6 g/dL with symptoms).  HPI: Patient is a 53 year old Caucasian female with past medical history significant for recently diagnosed ulcerative colitis, rectal bleeding, pneumonia, obesity, asthma and arthritis.  Apparently, patient has been having rectal bleeding with bloody diarrhea for about 4 months.  Over the last 2 to 3 months, patient reported that the bloody diarrhea has been worse.  Patient was seen by the GI physician, Dr. Dulce Craig about 2 weeks ago and underwent colonoscopy.  Pathology said to reveal ulcerative colitis, patient was placed on prednisone 40 Mg p.o. once daily.  Patient has not felt this any better.  Patient has been having 15-20 watery, bloody diarrhea according to the patient.  Patient had CBC done at the GI providers office yesterday and was found to have hemoglobin of 8.6 g/dL.  Patient's hemoglobin was 13.4 g/dl on 8/1/19145/09/2018.  Patient endorsed shortness of breath, dyspnea on exertion, dizziness and fatigue.  Patient was asked by the GI provider to come to the hospital for further assessment and management.  No headache, no neck pain, no chest pain, no fever or chills, no urinary symptoms.  Patient has nausea with intermittent vomiting.  No abdominal pain.  ED Course: On presentation to the ER, temperature was 98.5, blood pressure 109/66, heart rate of 60 beats per minute, respiratory rate of 16.  O2 sat is 100% on room air.  Hemoglobin was 8.6 with a serum creatinine of 1.16.  Patient is currently being transfused with 1 unit of packed red blood cells.  ER provider has discussed with the GI physician, Dr. Dulce Craig and Dr. Dulce Craig will see patient in consultation.    Pertinent labs: CBC reveals WBC of 5.8, hemoglobin  of 8.6, hematocrit of 26.6, and platelet count of 344.  Chemistry reveals sodium of 136, potassium of 4, chloride 106, CO2 21, BUN of 18 and serum creatinine of 1.16 with blood sugar of 164.  Albumin is 2.5.  Review of Systems:  Negative for fever, visual changes, sore throat, rash, new muscle aches, chest pain, dysuria, abdominal pain.  Past Medical History:  Diagnosis Date  . Arthritis   . Asthma   . Obesity   . Pneumonia     Past Surgical History:  Procedure Laterality Date  . JOINT REPLACEMENT    . TONSILLECTOMY       reports that she quit smoking about 7 years ago. Her smoking use included cigarettes. She has a 30.00 pack-year smoking history. She has never used smokeless tobacco. She reports that she does not drink alcohol or use drugs.  Allergies  Allergen Reactions  . Amoxicillin Rash and Hives    Family History  Problem Relation Age of Onset  . Allergies Mother   . Asthma Mother   . Rheum arthritis Mother   . Vascular Disease Mother   . Heart disease Father      Prior to Admission medications   Medication Sig Start Date End Date Taking? Authorizing Provider  gabapentin (NEURONTIN) 100 MG capsule Take 100 mg by mouth 3 (three) times daily as needed (for pain).  09/23/18  Yes [provider]  norethindrone (MICRONOR) 0.35 MG tablet Take 1 tablet by mouth daily. 08/09/18  Yes [provider]  Omega-3 Fatty Acids (FISH OIL PO) Take 1 tablet by  mouth daily.   Yes [provider]  ondansetron (ZOFRAN ODT) 4 MG disintegrating tablet Take 1 tablet (4 mg total) by mouth every 8 (eight) hours as needed for nausea or vomiting. 10/07/18  Yes Rancour, Jeannett SeniorStephen, MD  [facility] MG/ML SOAJ Take 125 mg by mouth every Wednesday. 09/18/18  Yes [provider]  predniSONE (DELTASONE) 10 MG tablet Take 40 mg by mouth daily.   Yes [provider]  Probiotic Product (PROBIOTIC PO) Take 1 tablet by mouth 2 (two) times a day.   Yes [provider]  traMADol (ULTRAM) 50 MG tablet Take 50 mg by mouth 3 (three) times daily as needed for moderate pain.  09/23/18  Yes [provider]    Physical Exam: Vitals:   11/11/18 1615 11/11/18 1630 11/11/18 1800 11/11/18 1825  BP:  (!) 100/59 (!) 105/54 (!) 105/54  Pulse: 74 63 (!) 56 61  Resp:  16 16 16   Temp:    99.1 F (37.3 C)  TempSrc:    Oral  SpO2: 96% 98% 98% 100%   Constitutional:  . Appears calm and comfortable.  Patient is obese. Eyes:  . Pallor. No jaundice.  ENMT:  . external ears, nose appear normal Neck:  . Neck is supple. No JVD Respiratory:  . CTA bilaterally, no w/r/r.  . Respiratory effort normal. No retractions or accessory muscle use Cardiovascular:  . S1S2 . No LE extremity edema   Abdomen:  . Abdomen is obese, soft and non tender. Organs are difficult to assess. Neurologic:  . Awake and alert. . Moves all limbs.  Wt Readings from Last 3 Encounters:  10/06/18 99.8 kg  12/17/14 99.3 kg  12/13/14 101.6 kg    I have personally reviewed following labs and imaging studies  Labs on Admission:  CBC: Recent Labs  Lab 11/11/18 1432  WBC 5.8  NEUTROABS 4.1  HGB 8.6*  HCT 26.6*  MCV 89.3  PLT 344   Basic Metabolic Panel: Recent Labs  Lab 11/11/18 1432 11/11/18 1519  NA 136  --   K 4.0  --   CL 106  --   CO2 21*  --   GLUCOSE 164*  --   BUN 8  --   CREATININE 1.16*  --   CALCIUM 8.0*  --   MG  --  2.2   Liver Function Tests: Recent Labs  Lab 11/11/18 1432  AST 15  ALT 22  ALKPHOS 50  BILITOT 0.4  PROT 5.7*  ALBUMIN 2.5*   No results for input(s): LIPASE, AMYLASE in the last 168 hours. No results for input(s): AMMONIA in the last 168 hours. Coagulation Profile: No results for input(s): INR, PROTIME in the last 168 hours. Cardiac Enzymes: No results for input(s): CKTOTAL, CKMB, CKMBINDEX, TROPONINI in the last 168 hours. BNP (last 3 results) No results for input(s): PROBNP in the last 8760 hours. HbA1C:  No results for input(s): HGBA1C in the last 72 hours. CBG: No results for input(s): GLUCAP in the last 168 hours. Lipid Profile: No results for input(s): CHOL, HDL, LDLCALC, TRIG, CHOLHDL, LDLDIRECT in the last 72 hours. Thyroid Function Tests: No results for input(s): TSH, T4TOTAL, FREET4, T3FREE, THYROIDAB in the last 72 hours. Anemia Panel: No results for input(s): VITAMINB12, FOLATE, FERRITIN, TIBC, IRON, RETICCTPCT in the last 72 hours. Urine analysis: No results found for: COLORURINE, APPEARANCEUR, LABSPEC, PHURINE, GLUCOSEU, HGBUR, BILIRUBINUR, KETONESUR, PROTEINUR, UROBILINOGEN, NITRITE, LEUKOCYTESUR Sepsis Labs: @LABRCNTIP (procalcitonin:4,lacticidven:4) ) Recent Results (from the past 240 hour(s))  SARS Coronavirus 2     Status: None   Collection Time: 11/11/18  3:55 PM  Result Value Ref Range Status   SARS Coronavirus 2 NOT DETECTED NOT DETECTED Final    Comment: (NOTE) SARS-CoV-2 target nucleic acids are NOT DETECTED. The SARS-CoV-2 RNA is generally detectable in upper and lower respiratory specimens during the acute phase of infection.  Negative  results do not preclude SARS-CoV-2 infection, do not rule out co-infections with other pathogens, and should not be used as the sole basis for treatment or other patient management decisions.  Negative results must be combined with clinical observations, patient history, and epidemiological information. The expected result is Not Detected. Fact Sheet for Patients: http://www.biofiredefense.com/wp-content/uploads/2020/03/BIOFIRE-COVID -19-patients.pdf Fact Sheet for Healthcare Providers: http://www.biofiredefense.com/wp-content/uploads/2020/03/BIOFIRE-COVID -19-hcp.pdf This test is not yet approved or cleared by the [place] and  has been authorized for detection and/or diagnosis of SARS-CoV-2 by FDA under an Emergency Use Authorization (EUA).  This EUA will remain in effec t (meaning this test can be used) for the  duration of  the COVID-19 declaration under Section 564(b)(1) of the Act, 21 U.S.C. section 360bbb-3(b)(1), unless the authorization is terminated or revoked sooner. Performed at [facility], [place] [address], [place], [place] [address]       Radiological Exams on Admission: No results found.  Active Problems:   Rectal bleed   Assessment/Plan GI bleed likely related to ulcerative colitis: Patient is currently symptomatic from degree of GI blood loss Admit patient to progressive unit Patient is currently being transfused with 1 unit of packed red blood cells H/H every 6 hours and transfuse packed red blood cells PRN Change oral prednisone to IV steroids GI team to see patient in the morning Further management will depend on hospital course.  Symptomatic anemia: Hemoglobin was 13.4 g/dL last month Hemoglobin is down to 8.6 g/dL Patient is currently being transfused packed red blood cells Monitor closely, and transfuse with packed red blood cells as deemed necessary  Moderate malnutrition: Patient has had poor appetite Albumin is 2.5 Consider calorie count when patient resumes regular diet  Obesity: Diet and exercise Further management on outpatient basis  DVT prophylaxis: SCD Code Status: Full code Family Communication:  Disposition Plan: Home eventually Consults called: ER provider has consulted the GI team Admission status: Inpatient  Time spent: 65 minutes  Dana Allan, MD  [facility] Pager #: [phone] 7PM-7AM contact night coverage as above  11/11/2018, 6:47 PM

## 2018-11-11 NOTE — ED Notes (Signed)
ED TO INPATIENT HANDOFF REPORT  ED Nurse Name and Phone #: Aundra MilletMegan [phone]  S Name/Age/Gender Connie Craig 53 y.o. female Room/Bed: 036C/036C  Code Status   Code Status: Not on file  Home/SNF/Other Home Patient oriented to: self, place, time and situation Is this baseline? Yes   Triage Complete: Triage complete  Chief Complaint Sent by Dr, lab work  Triage Note Pt states her GI Dr. Dr. Dulce Sellarutlaw  sent her to ED for lab work. Pt states she doesn't know the results of her lab work but was told she has ulcerative colitis.     Allergies Allergies  Allergen Reactions  . Amoxicillin Rash and Hives    Level of Care/Admitting Diagnosis ED Disposition    ED Disposition Condition Comment   Admit  [facility] [[id number]]  Level of Care: Progressive [102]  Covid Evaluation: N/A  Diagnosis: Rectal bleed [244010][[id number]]  Admitting Physician: Barnetta ChapelGBATA, SYLVESTER I [[id number]]  Attending Physician: Berton MountGBATA, SYLVESTER I [[id number]]  Estimated length of stay: past midnight tomorrow  Certification:: I certify this patient will need inpatient services for at least 2 midnights  PT Class (Do Not Modify): Inpatient [101]  PT Acc Code (Do Not Modify): Private [1]       B Medical/Surgery History Past Medical History:  Diagnosis Date  . Arthritis   . Asthma   . Obesity   . Pneumonia    Past Surgical History:  Procedure Laterality Date  . JOINT REPLACEMENT    . TONSILLECTOMY       A IV Location/Drains/Wounds Patient Lines/Drains/Airways Status   Active Line/Drains/Airways    Name:   Placement date:   Placement time:   Site:   Days:   Peripheral IV 11/11/18 Left Antecubital   11/11/18    1432    Antecubital   less than 1   Peripheral IV 11/11/18 Right Hand   11/11/18    1845    Hand   less than 1          Intake/Output Last 24 hours No intake or output data in the 24 hours ending 11/11/18 1940  Labs/Imaging Results for orders placed or performed during  the hospital encounter of 11/11/18 (from the past 48 hour(s))  Comprehensive metabolic panel     Status: Abnormal   Collection Time: 11/11/18  2:32 PM  Result Value Ref Range   Sodium 136 135 - 145 mmol/L   Potassium 4.0 3.5 - 5.1 mmol/L   Chloride 106 98 - 111 mmol/L   CO2 21 (L) 22 - 32 mmol/L   Glucose, Bld 164 (H) 70 - 99 mg/dL   BUN 8 6 - 20 mg/dL   Creatinine, Ser 2.721.16 (H) 0.44 - 1.00 mg/dL   Calcium 8.0 (L) 8.9 - 10.3 mg/dL   Total Protein 5.7 (L) 6.5 - 8.1 g/dL   Albumin 2.5 (L) 3.5 - 5.0 g/dL   AST 15 15 - 41 U/L   ALT 22 0 - 44 U/L   Alkaline Phosphatase 50 38 - 126 U/L   Total Bilirubin 0.4 0.3 - 1.2 mg/dL   GFR calc non Af Amer 54 (L) >60 mL/min   GFR calc Af Amer >60 >60 mL/min   Anion gap 9 5 - 15    Comment: Performed at [facility], [address] [place], [place] [address]  CBC with Differential     Status: Abnormal   Collection Time: 11/11/18  2:32 PM  Result Value Ref Range  WBC 5.8 4.0 - 10.5 K/uL   RBC 2.98 (L) 3.87 - 5.11 MIL/uL   Hemoglobin 8.6 (L) 12.0 - 15.0 g/dL   HCT 16.126.6 (L) 09.636.0 - 04.546.0 %   MCV 89.3 80.0 - 100.0 fL   MCH 28.9 26.0 - 34.0 pg   MCHC 32.3 30.0 - 36.0 g/dL   RDW 40.913.8 81.111.5 - 91.415.5 %   Platelets 344 150 - 400 K/uL   nRBC 0.0 0.0 - 0.2 %   Neutrophils Relative % 70 %   Neutro Abs 4.1 1.7 - 7.7 K/uL   Lymphocytes Relative 28 %   Lymphs Abs 1.6 0.7 - 4.0 K/uL   Monocytes Relative 2 %   Monocytes Absolute 0.1 0.1 - 1.0 K/uL   Eosinophils Relative 0 %   Eosinophils Absolute 0.0 0.0 - 0.5 K/uL   Basophils Relative 0 %   Basophils Absolute 0.0 0.0 - 0.1 K/uL   nRBC 0 0 /100 WBC   Abs Immature Granulocytes 0.00 0.00 - 0.07 K/uL   Polychromasia PRESENT     Comment: Performed at [facility], [address] [place], [place] [address]  Type and screen     Status: None (Preliminary result)   Collection Time: 11/11/18  2:32 PM  Result Value Ref Range   ABO/RH(D) A POS    Antibody Screen NEG    Sample Expiration       11/14/2018,2359 Performed at [facility], [address] [place], [place] [address]    Unit Number [id number]    Blood Component Type RED CELLS,LR    Unit division 00    Status of Unit ISSUED    Transfusion Status OK TO TRANSFUSE    Crossmatch Result Compatible    Unit Number [id number]    Blood Component Type RED CELLS,LR    Unit division 00    Status of Unit ALLOCATED    Transfusion Status OK TO TRANSFUSE    Crossmatch Result Compatible    Unit Number [id number]    Blood Component Type RED CELLS,LR    Unit division 00    Status of Unit ALLOCATED    Transfusion Status OK TO TRANSFUSE    Crossmatch Result Compatible   Prepare RBC     Status: None   Collection Time: 11/11/18  2:45 PM  Result Value Ref Range   Order Confirmation      ORDER PROCESSED BY BLOOD BANK Performed at [facility], [address] [place], [place] [address]   Magnesium     Status: None   Collection Time: 11/11/18  3:19 PM  Result Value Ref Range   Magnesium 2.2 1.7 - 2.4 mg/dL    Comment: Performed at [facility], [address] [place], [place] [address]  SARS Coronavirus 2     Status: None   Collection Time: 11/11/18  3:55 PM  Result Value Ref Range   SARS Coronavirus 2 NOT DETECTED NOT DETECTED    Comment: (NOTE) SARS-CoV-2 target nucleic acids are NOT DETECTED. The SARS-CoV-2 RNA is generally detectable in upper and lower respiratory specimens during the acute phase of infection.  Negative  results do not preclude SARS-CoV-2 infection, do not rule out co-infections with other pathogens, and should not be used as the sole basis for treatment or other patient management decisions.  Negative results must be combined with clinical observations, patient history, and epidemiological information. The expected result is Not Detected. Fact Sheet for Patients: http://www.biofiredefense.com/wp-content/uploads/2020/03/BIOFIRE-COVID -19-patients.pdf Fact Sheet  for  Healthcare Providers: http://www.biofiredefense.com/wp-content/uploads/2020/03/BIOFIRE-COVID -19-hcp.pdf This test is not yet approved or cleared by the [place] and  has been authorized for detection and/or diagnosis of SARS-CoV-2 by FDA under an Emergency Use Authorization (EUA).  This EUA will remain in effec t (meaning this test can be used) for the duration of  the COVID-19 declaration under Section 564(b)(1) of the Act, 21 U.S.C. section 360bbb-3(b)(1), unless the authorization is terminated or revoked sooner. Performed at [facility], [employer] [address] [place], [place] [address]    No results found.  Pending Labs [employer] (From admission, onward)    Start     Ordered   Signed and Held  HIV antibody (Routine Testing)  Once,   R     Signed and Held   Signed and Occupational hygienist morning,   R     Signed and Held   Signed and Held  CBC  Tomorrow morning,   R     Signed and Held   Signed and Held  Hemoglobin and hematocrit, blood  Now then every 6 hours,   R     Signed and Held          Vitals/Pain Today's Vitals   11/11/18 1800 11/11/18 1825 11/11/18 1853 11/11/18 1930  BP: (!) 105/54 (!) 105/54 91/79 109/66  Pulse: (!) 56 61 64 60  Resp: 16 16 16 16   Temp:  99.1 F (37.3 C) 98.5 F (36.9 C)   TempSrc:  Oral Oral   SpO2: 98% 100% 100% 100%  PainSc:        Isolation Precautions No active isolations  Medications Medications  0.9 %  sodium chloride infusion (has no administration in time range)  methylPREDNISolone sodium succinate (SOLU-MEDROL) 125 mg/2 mL injection 60 mg (60 mg Intravenous Given 11/11/18 1834)    Mobility walks Low fall risk   Focused Assessments gastro   R Recommendations: See Admitting Provider Note  Report given to:   Additional Notes:

## 2018-11-11 NOTE — ED Triage Notes (Signed)
Pt states her GI Dr. Dr. Paulita Fujita  sent her to ED for lab work. Pt states she doesn't know the results of her lab work but was told she has ulcerative colitis.

## 2018-11-11 NOTE — ED Provider Notes (Signed)
[facility] EMERGENCY DEPARTMENT Provider Note   CSN: [id number] Arrival date & time: 11/11/18  1347    History   Chief Complaint No chief complaint on file.   HPI Connie Craig is a 53 y.o. female.     HPI 53 year old female comes in a chief complaint of abnormal labs.  Patient was recently diagnosed with ulcerative colitis by Dr. Stacie Glaze GI.  She states that she has had about 4 months of loose bowel movements.  Over the last 2 months her illness is gotten worse and more recently she has been having large volume of bloody bowel movements.  Yesterday she had about 15 episodes of watery and bloody stools.  She continues to also have some nausea.  She had a colonoscopy completed 2 weeks ago and was diagnosed with ulcerative colitis.  She has been put on 40 mg prednisone, which has helped her with her appetite, but given her persistent symptoms she was seen by GI yesterday and her blood work showed abnormalities which resulted in the clinic asking her to come to the hospital.  Patient's hemoglobin is noted to be low in the ER.  She does indicate that she has had dizziness when she walks and also episodes of shortness of breath with exertion and fatigue.   Patient is not on any blood thinners and has not required blood transfusion in the past.  Past Medical History:  Diagnosis Date  . Arthritis   . Asthma   . Obesity   . Pneumonia     Patient Active Problem List   Diagnosis Date Noted  . Obesity 12/13/2014  . Pulmonary infiltrates 07/13/2014    Past Surgical History:  Procedure Laterality Date  . JOINT REPLACEMENT    . TONSILLECTOMY       OB History   No obstetric history on file.      Home Medications    Prior to Admission medications   Medication Sig Start Date End Date Taking? Authorizing Provider  gabapentin (NEURONTIN) 100 MG capsule Take 100 mg by mouth 3 (three) times daily as needed (for pain).  09/23/18  Yes [provider]  norethindrone (MICRONOR) 0.35 MG tablet Take 1 tablet by mouth daily. 08/09/18  Yes [provider]  Omega-3 Fatty Acids (FISH OIL PO) Take 1 tablet by mouth daily.   Yes [provider]  ondansetron (ZOFRAN ODT) 4 MG disintegrating tablet Take 1 tablet (4 mg total) by mouth every 8 (eight) hours as needed for nausea or vomiting. 10/07/18  Yes Rancour, Annie Main, MD  [facility] CLICKJECT 433 MG/ML SOAJ Take 125 mg by mouth every Wednesday. 09/18/18  Yes [provider]  predniSONE (DELTASONE) 10 MG tablet Take 40 mg by mouth daily.   Yes [provider]  Probiotic Product (PROBIOTIC PO) Take 1 tablet by mouth 2 (two) times a day.   Yes [provider]  traMADol (ULTRAM) 50 MG tablet Take 50 mg by mouth 3 (three) times daily as needed for moderate pain.  09/23/18  Yes [provider]  ibuprofen (ADVIL,MOTRIN) 600 MG tablet Take 1 tablet (600 mg total) by mouth every 8 (eight) hours as needed. Patient not taking: Reported on 11/20/2017 06/30/14   Jola Schmidt, MD  omeprazole (PRILOSEC) 20 MG capsule Take 1 capsule (20 mg total) by mouth daily. Patient not taking: Reported on 11/11/2018 10/07/18   Ezequiel Essex, MD    Family History Family History  Problem Relation Age of Onset  . Allergies  Mother   . Asthma Mother   . Rheum arthritis Mother   . Vascular Disease Mother   . Heart disease Father     Social History Social History   Tobacco Use  . Smoking status: Former Smoker    Packs/day: 1.00    Years: 30.00    Pack years: 30.00    Types: Cigarettes    Last attempt to quit: 03/22/2011    Years since quitting: 7.6  . Smokeless tobacco: Never Used  Substance Use Topics  . Alcohol use: No    Alcohol/week: 0.0 standard drinks  . Drug use: No     Allergies   Amoxicillin   Review of Systems Review of Systems  Constitutional: Positive for activity change.  Respiratory: Negative for shortness of breath.   Cardiovascular: Negative for  chest pain.  Gastrointestinal: Positive for blood in stool, nausea and vomiting.  Allergic/Immunologic: Negative for immunocompromised state.  Hematological: Does not bruise/bleed easily.  All other systems reviewed and are negative.    Physical Exam Updated Vital Signs BP 108/60   Pulse 68   Temp 98.7 F (37.1 C) (Oral)   Resp 18   SpO2 96%   Physical Exam Vitals signs and nursing note reviewed.  Constitutional:      Appearance: She is well-developed.  HENT:     Head: Normocephalic and atraumatic.  Eyes:     Pupils: Pupils are equal, round, and reactive to light.  Neck:     Musculoskeletal: Neck supple.  Cardiovascular:     Rate and Rhythm: Normal rate and regular rhythm.     Heart sounds: Normal heart sounds.  Pulmonary:     Effort: Pulmonary effort is normal.  Abdominal:     General: There is no distension.     Palpations: Abdomen is soft.     Tenderness: There is no abdominal tenderness. There is no guarding or rebound.  Skin:    General: Skin is warm and dry.  Neurological:     Mental Status: She is alert and oriented to person, place, and time.      ED Treatments / Results  Labs (all labs ordered are listed, but only abnormal results are displayed) Labs Reviewed  COMPREHENSIVE METABOLIC PANEL - Abnormal; Notable for the following components:      Result Value   CO2 21 (*)    Glucose, Bld 164 (*)    Creatinine, Ser 1.16 (*)    Calcium 8.0 (*)    Total Protein 5.7 (*)    Albumin 2.5 (*)    GFR calc non Af Amer 54 (*)    All other components within normal limits  CBC WITH DIFFERENTIAL/PLATELET - Abnormal; Notable for the following components:   RBC 2.98 (*)    Hemoglobin 8.6 (*)    HCT 26.6 (*)    All other components within normal limits  MAGNESIUM  TYPE AND SCREEN    EKG None  Radiology No results found.  Procedures .Critical Care Performed by: Derwood KaplanNanavati, Chiquita Heckert, MD Authorized by: Derwood KaplanNanavati, Jermon Chalfant, MD   Critical care provider statement:     Critical care time (minutes):  36   Critical care was necessary to treat or prevent imminent or life-threatening deterioration of the following conditions: symptomatic anemia.   Critical care was time spent personally by me on the following activities:  Discussions with consultants, evaluation of patient's response to treatment, examination of patient, ordering and performing treatments and interventions, ordering and review of laboratory studies, ordering and review of  radiographic studies, pulse oximetry, re-evaluation of patient's condition, obtaining history from patient or surrogate and review of old charts   (including critical care time)  Medications Ordered in ED Medications - No data to display   Initial Impression / Assessment and Plan / ED Course  I have reviewed the triage vital signs and the nursing notes.  Pertinent labs & imaging results that were available during my care of the patient were reviewed by me and considered in my medical decision making (see chart for details).        53 year old female comes in a chief complaint of bloody stools.  She was recently diagnosed with ulcerative colitis.  She is not on any blood thinners or antiplatelet agents, but states that she has had worsening diarrheal illness for the last several weeks.  She is having bloody stools about 15 times a day.  Her hemoglobin indeed is noted to be at 8.6.  Last hemoglobin a month ago was 13.4.  Patient is having shortness of breath with exertion, fatigue and dizziness upon walking, therefore she is symptomatic from her acute on chronic blood loss.  I spoke with Dr. Dulce Sellarutlaw, GI.  He wants patient to get 60 milligrams IV Solu-Medrol twice daily and discontinuing the prednisone for now.  They will see the patient tomorrow.  Clear liquid diet recommended until she is assessed tomorrow morning.    Results from the ER workup discussed with the patient face to face and all questions answered to the best of  my ability.  Final Clinical Impressions(s) / ED Diagnoses   Final diagnoses:  Symptomatic anemia  Colitis with rectal bleeding    ED Discharge Orders    None       Derwood KaplanNanavati, Jessie Cowher, MD 11/11/18 [id number]

## 2018-11-12 ENCOUNTER — Encounter (HOSPITAL_COMMUNITY): Payer: Self-pay | Admitting: *Deleted

## 2018-11-12 DIAGNOSIS — K625 Hemorrhage of anus and rectum: Secondary | ICD-10-CM

## 2018-11-12 DIAGNOSIS — K529 Noninfective gastroenteritis and colitis, unspecified: Secondary | ICD-10-CM

## 2018-11-12 DIAGNOSIS — D649 Anemia, unspecified: Secondary | ICD-10-CM

## 2018-11-12 LAB — HEMOGLOBIN AND HEMATOCRIT, BLOOD
HCT: 30.5 % — ABNORMAL LOW (ref 36.0–46.0)
HCT: 30.7 % — ABNORMAL LOW (ref 36.0–46.0)
HCT: 31.3 % — ABNORMAL LOW (ref 36.0–46.0)
HCT: 31.4 % — ABNORMAL LOW (ref 36.0–46.0)
Hemoglobin: 10 g/dL — ABNORMAL LOW (ref 12.0–15.0)
Hemoglobin: 10 g/dL — ABNORMAL LOW (ref 12.0–15.0)
Hemoglobin: 10.1 g/dL — ABNORMAL LOW (ref 12.0–15.0)
Hemoglobin: 10.2 g/dL — ABNORMAL LOW (ref 12.0–15.0)

## 2018-11-12 LAB — CBC
HCT: 31.2 % — ABNORMAL LOW (ref 36.0–46.0)
Hemoglobin: 10.2 g/dL — ABNORMAL LOW (ref 12.0–15.0)
MCH: 28.7 pg (ref 26.0–34.0)
MCHC: 32.7 g/dL (ref 30.0–36.0)
MCV: 87.9 fL (ref 80.0–100.0)
Platelets: 384 10*3/uL (ref 150–400)
RBC: 3.55 MIL/uL — ABNORMAL LOW (ref 3.87–5.11)
RDW: 14.3 % (ref 11.5–15.5)
WBC: 8.2 10*3/uL (ref 4.0–10.5)
nRBC: 0 % (ref 0.0–0.2)

## 2018-11-12 LAB — BASIC METABOLIC PANEL
Anion gap: 8 (ref 5–15)
BUN: 7 mg/dL (ref 6–20)
CO2: 24 mmol/L (ref 22–32)
Calcium: 8.2 mg/dL — ABNORMAL LOW (ref 8.9–10.3)
Chloride: 105 mmol/L (ref 98–111)
Creatinine, Ser: 1 mg/dL (ref 0.44–1.00)
GFR calc Af Amer: >60 mL/min (ref >60)
GFR calc non Af Amer: >60 mL/min (ref >60)
Glucose, Bld: 140 mg/dL — ABNORMAL HIGH (ref 70–99)
Potassium: 4 mmol/L (ref 3.5–5.1)
Sodium: 137 mmol/L (ref 135–145)

## 2018-11-12 LAB — HIV ANTIBODY (ROUTINE TESTING W REFLEX): HIV Screen 4th Generation wRfx: NONREACTIVE

## 2018-11-12 MED ORDER — BOOST / RESOURCE BREEZE PO LIQD CUSTOM
1.0000 | Freq: Three times a day (TID) | ORAL | Status: DC
  Administered 2018-11-12 – 2018-11-18 (×14): 1 via ORAL

## 2018-11-12 MED ORDER — ADULT MULTIVITAMIN W/MINERALS CH
1.0000 | ORAL_TABLET | Freq: Every day | ORAL | Status: DC
  Administered 2018-11-12 – 2018-11-18 (×7): 1 via ORAL
  Filled 2018-11-12 (×6): qty 1

## 2018-11-12 NOTE — Plan of Care (Signed)
  Problem: Education: Goal: Ability to identify signs and symptoms of gastrointestinal bleeding will improve Outcome: Progressing   Problem: Fluid Volume: Goal: Will show no signs and symptoms of excessive bleeding Outcome: Progressing   Problem: Clinical Measurements: Goal: Complications related to the disease process, condition or treatment will be avoided or minimized Outcome: Progressing   

## 2018-11-12 NOTE — Progress Notes (Signed)
[facility]  PROGRESS NOTE  Connie Craig WUJ:[id number] DOB: 10/21/1965 DOA: 11/11/2018 PCP: Blair HeysEhinger, Robert, MD   Brief HPI:   53 year old female with recent diagnosis of ulcerative colitis, obesity, asthma, arthritis came with rectal bleeding with bloody diarrhea for about 4 months.  She was seen by GI 2 weeks ago underwent colonoscopy at that time pathology revealed findings consistent with ulcerative colitis.  Patient was started on prednisone 40 mg p.o. daily.  Patient states that she did not feel better and continued to have 1520 watery bloody bowel movements.  Patient's hemoglobin dropped to 8.6, previous hemoglobin was 13.4 on 10/06/2018.    Subjective   This morning patient continues to have bloody bowel movements.Hemoglobin up to 10.0, status post 1 unit PRBC.   Assessment/Plan:     1. GI bleed from ulcerative colitis flareup-started on IV Solu-Medrol 40 mg every 12 hours.  GI has been consulted.  Plan for IV steroids as above.  2. Symptomatic anemia-patient's hemoglobin has dropped from 13.4-8.6.  She is status post 1 unit PRBC.  Today hemoglobin is 10.0.  Follow CBC in a.m. and transfuse for hemoglobin less than 7.  3. Moderate malnutrition-albumin 2.5, likely from poor p.o. intake from ulcerative colitis.  Hopefully should improve after improvement of inflammation with IV steroids as well.     CBC: Recent Labs  Lab 11/11/18 1432 11/11/18 2356 11/12/18 0308 11/12/18 0954  WBC 5.8  --  8.2  --   NEUTROABS 4.1  --   --   --   HGB 8.6* 10.0* 10.2* 10.0*  HCT 26.6* 30.5* 31.2* 30.7*  MCV 89.3  --  87.9  --   PLT 344  --  384  --     Basic Metabolic Panel: Recent Labs  Lab 11/11/18 1432 11/11/18 1519 11/12/18 0308  NA 136  --  137  K 4.0  --  4.0  CL 106  --  105  CO2 21*  --  24  GLUCOSE 164*  --  140*  BUN 8  --  7  CREATININE 1.16*  --  1.00  CALCIUM 8.0*  --  8.2*  MG  --  2.2  --      DVT prophylaxis: SCDs  Code Status: Full  code  Family Communication: No family at bedside  Disposition Plan: likely home once diarrhea resolves.     Consultants:  Gastroenterology  Procedures:     Antibiotics:   Anti-infectives (From admission, onward)   None       Objective   Vitals:   11/11/18 2200 11/11/18 2314 11/12/18 0529 11/12/18 1327  BP:  (!) 103/49 (!) 97/43 (!) 101/53  Pulse:  (!) 54 (!) 53 64  Resp:    18  Temp:  98.6 F (37 C) 98.4 F (36.9 C) 98.6 F (37 C)  TempSrc:  Oral Oral   SpO2:  98% 98% 99%  Weight: 98.7 kg     Height: 5\' 3"  (1.6 m)       Intake/Output Summary (Last 24 hours) at 11/12/2018 1346 Last data filed at 11/11/2018 2114 Gross per 24 hour  Intake 315 ml  Output -  Net 315 ml   Filed Weights   11/11/18 2200  Weight: 98.7 kg     Physical Examination:  General-appears in no acute distress Heart-S1-S2, regular, no murmur auscultated Lungs-clear to auscultation bilaterally, no wheezing or crackles auscultated Abdomen-soft, nontender, no organomegaly Extremities-no edema in the lower extremities Neuro-alert, oriented x3, no focal deficit noted  Data Reviewed: I have personally reviewed following labs and imaging studies   Recent Results (from the past 240 hour(s))  SARS Coronavirus 2     Status: None   Collection Time: 11/11/18  3:55 PM  Result Value Ref Range Status   SARS Coronavirus 2 NOT DETECTED NOT DETECTED Final    Comment: (NOTE) SARS-CoV-2 target nucleic acids are NOT DETECTED. The SARS-CoV-2 RNA is generally detectable in upper and lower respiratory specimens during the acute phase of infection.  Negative  results do not preclude SARS-CoV-2 infection, do not rule out co-infections with other pathogens, and should not be used as the sole basis for treatment or other patient management decisions.  Negative results must be combined with clinical observations, patient history, and epidemiological information. The expected result is Not  Detected. Fact Sheet for Patients: http://www.biofiredefense.com/wp-content/uploads/2020/03/BIOFIRE-COVID -19-patients.pdf Fact Sheet for Healthcare Providers: http://www.biofiredefense.com/wp-content/uploads/2020/03/BIOFIRE-COVID -19-hcp.pdf This test is not yet approved or cleared by the [place] and  has been authorized for detection and/or diagnosis of SARS-CoV-2 by FDA under an Emergency Use Authorization (EUA).  This EUA will remain in effec t (meaning this test can be used) for the duration of  the COVID-19 declaration under Section 564(b)(1) of the Act, 21 U.S.C. section 360bbb-3(b)(1), unless the authorization is terminated or revoked sooner. Performed at [facility], [place] [address] [place], [place] [address]      Liver Function Tests: Recent Labs  Lab 11/11/18 1432  AST 15  ALT 22  ALKPHOS 50  BILITOT 0.4  PROT 5.7*  ALBUMIN 2.5*   No results for input(s): LIPASE, AMYLASE in the last 168 hours. No results for input(s): AMMONIA in the last 168 hours.  Cardiac Enzymes: No results for input(s): CKTOTAL, CKMB, CKMBINDEX, TROPONINI in the last 168 hours. BNP (last 3 results) No results for input(s): BNP in the last 8760 hours.  ProBNP (last 3 results) No results for input(s): PROBNP in the last 8760 hours.    Studies: No results found.  Scheduled Meds: . methylPREDNISolone (SOLU-MEDROL) injection  40 mg Intravenous Q12H  . norethindrone  1 tablet Oral Daily    Admission status: Inpatient: Based on patients clinical presentation and evaluation of above clinical data, I have made determination that patient meets Inpatient criteria at this time.  Time spent: 20 min  [facility] Pager [phone]. If 7PM-7AM, please contact night-coverage at [email or web address], Office  [phone]  password [email or web address]  11/12/2018, 1:46 PM  LOS: 1 day

## 2018-11-12 NOTE — Consult Note (Signed)
Subjective:   HPI  The patient is a 53 year old female who was recently diagnosed with ulcerative colitis a few weeks ago by Dr. Paulita Fujita with colonoscopy and confirmed by biopsies.  She was put on prednisone 40 mg daily.  Her presenting symptoms which she had had for several months were that of multiple episodes of diarrhea and rectal bleeding.  After starting the prednisone she did not notice much improvement in her symptoms and given that was told to come to the hospital for further treatment.  She has been started on Solu-Medrol here in the hospital.  She states she was written a prescription for mesalamine however never started taking it.  She does have a history of rheumatoid arthritis and has been on Humira in the past without much response according to her.  She is currently in the hospital for further treatment of ulcerative colitis.  Review of Systems No chest pain or shortness of breath  Past Medical History:  Diagnosis Date  . Arthritis   . Asthma   . Obesity   . Pneumonia    Past Surgical History:  Procedure Laterality Date  . JOINT REPLACEMENT    . TONSILLECTOMY     Social History   Socioeconomic History  . Marital status: Single    Spouse name: Not on file  . Number of children: Not on file  . Years of education: Not on file  . Highest education level: Not on file  Occupational History  . Occupation: CNA   Social Needs  . Financial resource strain: Not on file  . Food insecurity:    Worry: Not on file    Inability: Not on file  . Transportation needs:    Medical: Not on file    Non-medical: Not on file  Tobacco Use  . Smoking status: Former Smoker    Packs/day: 1.00    Years: 30.00    Pack years: 30.00    Types: Cigarettes    Last attempt to quit: 03/22/2011    Years since quitting: 7.6  . Smokeless tobacco: Never Used  Substance and Sexual Activity  . Alcohol use: No    Alcohol/week: 0.0 standard drinks  . Drug use: No  . Sexual activity: Not on file   Lifestyle  . Physical activity:    Days per week: Not on file    Minutes per session: Not on file  . Stress: Not on file  Relationships  . Social connections:    Talks on phone: Not on file    Gets together: Not on file    Attends religious service: Not on file    Active member of club or organization: Not on file    Attends meetings of clubs or organizations: Not on file    Relationship status: Not on file  . Intimate partner violence:    Fear of current or ex partner: Not on file    Emotionally abused: Not on file    Physically abused: Not on file    Forced sexual activity: Not on file  Other Topics Concern  . Not on file  Social History Narrative  . Not on file   family history includes Allergies in her mother; Asthma in her mother; Heart disease in her father; Rheum arthritis in her mother; Vascular Disease in her mother.  Current Facility-Administered Medications:  .  gabapentin (NEURONTIN) capsule 100 mg, 100 mg, Oral, TID PRN, Dana Allan I, MD, 100 mg at 11/11/18 2204 .  methylPREDNISolone sodium succinate (SOLU-MEDROL) 40  mg/mL injection 40 mg, 40 mg, Intravenous, Q12H, Berton Mountgbata, Sylvester I, MD, 40 mg at 11/12/18 0948 .  norethindrone (MICRONOR) 0.35 MG tablet 0.35 mg, 1 tablet, Oral, Daily, Berton Mountgbata, Sylvester I, MD, 0.35 mg at 11/12/18 0948 Allergies  Allergen Reactions  . Amoxicillin Rash and Hives     Objective:     BP (!) 97/43 (BP Location: Right Arm)   Pulse (!) 53   Temp 98.4 F (36.9 C) (Oral)   Resp 16   Ht 5\' 3"  (1.6 m)   Wt 98.7 kg   SpO2 98%   BMI 38.53 kg/m   She is alert and oriented and in no acute distress  Heart regular rhythm no murmurs  Lungs clear  Abdomen is soft with mild tenderness but no rebound or guarding  Laboratory No components found for: D1    Assessment:     Ulcerative colitis currently refractory to prednisone therapy as an outpatient.      Plan:     Hospitalization.  IV Solu-Medrol.  Follow  clinically.

## 2018-11-12 NOTE — Progress Notes (Signed)
Initial Nutrition Assessment  RD working remotely.  DOCUMENTATION CODES:   Obesity unspecified  INTERVENTION:   -Boost Breeze po TID, each supplement provides 250 kcal and 9 grams of protein -MVI with minerals daily -RD will follow for diet advancement and adjust supplement regimen as appropriate  NUTRITION DIAGNOSIS:   Increased nutrient needs related to chronic illness(UC) as evidenced by estimated needs.  GOAL:   Patient will meet greater than or equal to 90% of their needs  MONITOR:   PO intake, Supplement acceptance, Diet advancement, Labs, Weight trends, Skin, I & O's  REASON FOR ASSESSMENT:   Malnutrition Screening Tool    ASSESSMENT:   Patient is a 53 year old Caucasian female with past medical history significant for recently diagnosed ulcerative colitis, rectal bleeding, pneumonia, obesity, asthma and arthritis.  Apparently, patient has been having rectal bleeding with bloody diarrhea for about 4 months.  Over the last 2 to 3 months, patient reported that the bloody diarrhea has been worse.  Patient was seen by the GI physician, Dr. Paulita Fujita about 2 weeks ago and underwent colonoscopy.  Pathology said to reveal ulcerative colitis, patient was placed on prednisone 40 Mg p.o. once daily.  Patient has not felt this any better.  Patient has been having 15-20 watery, bloody diarrhea according to the patient.  Patient had CBC done at the GI providers office yesterday and was found to have hemoglobin of 8.6 g/dL.  Patient's hemoglobin was 13.4 g/dl on 10/06/2018.  Patient endorsed shortness of breath, dyspnea on exertion, dizziness and fatigue.  Patient was asked by the GI provider to come to the hospital for further assessment and management.  No headache, no neck pain, no chest pain, no fever or chills, no urinary symptoms.  Patient has nausea with intermittent vomiting.  No abdominal pain.  Pt admitted with GIB likely secondary to ulcerative colitis.   Reviewed I/O's: +315 ml x  24 hours  Attempted to call pt to obtain further history, however, no answer. RD unable to obtain further nutrition-related history at this time.   Per chart review, pt was diagnosed with UC approximately 2 weeks ago after going outpatient colonoscopy with GI. She has been experiencing 15-20 occurrences of diarrhea daily PTA.   Reviewed wt hx; pt has experienced a 1.1% wt loss over the past month, which is not significant for time frame.   Pt is currently on a full liquid diet, however, no meal completion data currently available to assess. Given limitation of full liquid diet, will add oral nutrition supplements to help pt meet nutritional needs.   Medications reviewed and include solu-medrol.   Labs reviewed.   Diet Order:   Diet Order            Diet full liquid Room service appropriate? Yes; Fluid consistency: Thin  Diet effective now              EDUCATION NEEDS:   No education needs have been identified at this time  Skin:  Skin Assessment: Reviewed RN Assessment  Last BM:  11/11/18  Height:   Ht Readings from Last 1 Encounters:  11/11/18 5\' 3"  (1.6 m)    Weight:   Wt Readings from Last 1 Encounters:  11/11/18 98.7 kg    Ideal Body Weight:  52.3 kg  BMI:  Body mass index is 38.53 kg/m.  Estimated Nutritional Needs:   Kcal:  1650-1850  Protein:  90-105 grams  Fluid:  > 1.6 L    Damieon Armendariz A. Jimmye Norman, RD, LDN,  CDCES Registered Dietitian II Certified Diabetes Care and Education Specialist Pager: [phone] After hours Pager: [phone]

## 2018-11-13 ENCOUNTER — Encounter (HOSPITAL_COMMUNITY): Payer: Self-pay

## 2018-11-13 LAB — CBC
HCT: 31.8 % — ABNORMAL LOW (ref 36.0–46.0)
Hemoglobin: 10.2 g/dL — ABNORMAL LOW (ref 12.0–15.0)
MCH: 28.6 pg (ref 26.0–34.0)
MCHC: 32.1 g/dL (ref 30.0–36.0)
MCV: 89.1 fL (ref 80.0–100.0)
Platelets: 385 10*3/uL (ref 150–400)
RBC: 3.57 MIL/uL — ABNORMAL LOW (ref 3.87–5.11)
RDW: 14.3 % (ref 11.5–15.5)
WBC: 7.7 10*3/uL (ref 4.0–10.5)
nRBC: 0.3 % — ABNORMAL HIGH (ref 0.0–0.2)

## 2018-11-13 LAB — COMPREHENSIVE METABOLIC PANEL
ALT: 20 U/L (ref 0–44)
AST: 19 U/L (ref 15–41)
Albumin: 2.6 g/dL — ABNORMAL LOW (ref 3.5–5.0)
Alkaline Phosphatase: 50 U/L (ref 38–126)
Anion gap: 9 (ref 5–15)
BUN: 12 mg/dL (ref 6–20)
CO2: 21 mmol/L — ABNORMAL LOW (ref 22–32)
Calcium: 8.3 mg/dL — ABNORMAL LOW (ref 8.9–10.3)
Chloride: 108 mmol/L (ref 98–111)
Creatinine, Ser: 1.03 mg/dL — ABNORMAL HIGH (ref 0.44–1.00)
GFR calc Af Amer: >60 mL/min (ref >60)
GFR calc non Af Amer: >60 mL/min (ref >60)
Glucose, Bld: 141 mg/dL — ABNORMAL HIGH (ref 70–99)
Potassium: 5.1 mmol/L (ref 3.5–5.1)
Sodium: 138 mmol/L (ref 135–145)
Total Bilirubin: 0.5 mg/dL (ref 0.3–1.2)
Total Protein: 6 g/dL — ABNORMAL LOW (ref 6.5–8.1)

## 2018-11-13 LAB — C DIFFICILE QUICK SCREEN W PCR REFLEX
C Diff antigen: NEGATIVE
C Diff interpretation: NOT DETECTED
C Diff toxin: NEGATIVE

## 2018-11-13 NOTE — Progress Notes (Signed)
She is still having diarrhea and rectal bleeding.  Not much abdominal pain.  Tolerating her diet okay.  She does not appear in any distress.  For now we will continue her IV Solu-Medrol and try to improve the symptoms of her ulcerative colitis flareup.  She will need to discuss later as an outpatient with Dr. Paulita Fujita further management of her ulcerative colitis.

## 2018-11-13 NOTE — Progress Notes (Addendum)
[facility]  PROGRESS NOTE  Connie Craig WGN:[id number] DOB: 06/14/1965 DOA: 11/11/2018 PCP: Blair HeysEhinger, Robert, MD   Brief HPI:   53 year old female with recent diagnosis of ulcerative colitis, obesity, asthma, arthritis came with rectal bleeding with bloody diarrhea for about 4 months.  She was seen by GI 2 weeks ago underwent colonoscopy at that time pathology revealed findings consistent with ulcerative colitis.  Patient was started on prednisone 40 mg p.o. daily.  Patient states that she did not feel better and continued to have 1520 watery bloody bowel movements.  Patient's hemoglobin dropped to 8.6, previous hemoglobin was 13.4 on 10/06/2018.    Subjective   Patient seen and examined, continues to have bloody bowel movements.  Patient says that she slept well for about 6 hours last night but this morning she started having diarrhea again.  Denies abdominal pain.   Assessment/Plan:     1. GI bleed from ulcerative colitis flareup-started on IV Solu-Medrol 40 mg every 12 hours.  GI is following.   Plan for IV steroids as above.  Continue full liquid diet for now.  2. Symptomatic anemia-patient's hemoglobin has dropped from 13.4-8.6.  She is status post 1 unit PRBC.  Today hemoglobin is 10.2.  Follow CBC in a.m. and transfuse for hemoglobin less than 7.    3. Moderate malnutrition-albumin 2.5, likely from poor p.o. intake from ulcerative colitis.  Hopefully should improve after improvement of inflammation with IV steroids as well.     CBC: Recent Labs  Lab 11/11/18 1432  11/12/18 0308 11/12/18 0954 11/12/18 1446 11/12/18 2120 11/13/18 0334  WBC 5.8  --  8.2  --   --   --  7.7  NEUTROABS 4.1  --   --   --   --   --   --   HGB 8.6*   < > 10.2* 10.0* 10.1* 10.2* 10.2*  HCT 26.6*   < > 31.2* 30.7* 31.4* 31.3* 31.8*  MCV 89.3  --  87.9  --   --   --  89.1  PLT 344  --  384  --   --   --  385   < > = values in this interval not displayed.    Basic Metabolic  Panel: Recent Labs  Lab 11/11/18 1432 11/11/18 1519 11/12/18 0308 11/13/18 0334  NA 136  --  137 138  K 4.0  --  4.0 5.1  CL 106  --  105 108  CO2 21*  --  24 21*  GLUCOSE 164*  --  140* 141*  BUN 8  --  7 12  CREATININE 1.16*  --  1.00 1.03*  CALCIUM 8.0*  --  8.2* 8.3*  MG  --  2.2  --   --      DVT prophylaxis: SCDs  Code Status: Full code  Family Communication: No family at bedside  Disposition Plan: likely home once diarrhea resolves.     Consultants:  Gastroenterology  Procedures:     Antibiotics:   Anti-infectives (From admission, onward)   None       Objective   Vitals:   11/12/18 2128 11/13/18 0537 11/13/18 0538 11/13/18 1311  BP: (!) 106/51 (!) 92/42 (!) 95/52 (!) 92/54  Pulse: (!) 54 (!) 52 (!) 52 (!) 58  Resp: 16 16  18   Temp: 97.9 F (36.6 C) 98.7 F (37.1 C)  98.6 F (37 C)  TempSrc: Oral Oral  Oral  SpO2: 99% 99%  99%  Weight:  Height:       No intake or output data in the 24 hours ending 11/13/18 1323 Filed Weights   11/11/18 2200  Weight: 98.7 kg     Physical Examination:  General-appears in no acute distress Heart-S1-S2, regular, no murmur auscultated Lungs-clear to auscultation bilaterally, no wheezing or crackles auscultated Abdomen-soft, nontender, no organomegaly Extremities-no edema in the lower extremities Neuro-alert, oriented x3, no focal deficit noted  Data Reviewed: I have personally reviewed following labs and imaging studies   Recent Results (from the past 240 hour(s))  SARS Coronavirus 2     Status: None   Collection Time: 11/11/18  3:55 PM  Result Value Ref Range Status   SARS Coronavirus 2 NOT DETECTED NOT DETECTED Final    Comment: (NOTE) SARS-CoV-2 target nucleic acids are NOT DETECTED. The SARS-CoV-2 RNA is generally detectable in upper and lower respiratory specimens during the acute phase of infection.  Negative  results do not preclude SARS-CoV-2 infection, do not rule  out co-infections with other pathogens, and should not be used as the sole basis for treatment or other patient management decisions.  Negative results must be combined with clinical observations, patient history, and epidemiological information. The expected result is Not Detected. Fact Sheet for Patients: http://www.biofiredefense.com/wp-content/uploads/2020/03/BIOFIRE-COVID -19-patients.pdf Fact Sheet for Healthcare Providers: http://www.biofiredefense.com/wp-content/uploads/2020/03/BIOFIRE-COVID -19-hcp.pdf This test is not yet approved or cleared by the [place] FDA and  has been authorized for detection and/or diagnosis of SARS-CoV-2 by FDA under an Emergency Use Authorization (EUA).  This EUA will remain in effec t (meaning this test can be used) for the duration of  the COVID-19 declaration under Section 564(b)(1) of the Act, 21 U.S.C. section 360bbb-3(b)(1), unless the authorization is terminated or revoked sooner. Performed at [facility], [address] [place], [place] [address]   C difficile quick scan w PCR reflex     Status: None   Collection Time: 11/13/18  4:26 AM   Specimen: STOOL  Result Value Ref Range Status   C Diff antigen NEGATIVE NEGATIVE Final   C Diff toxin NEGATIVE NEGATIVE Final   C Diff interpretation No C. difficile detected.  Final    Comment: Performed at [facility], [address] [place], [place] [address]     Liver Function Tests: Recent Labs  Lab 11/11/18 1432 11/13/18 0334  AST 15 19  ALT 22 20  ALKPHOS 50 50  BILITOT 0.4 0.5  PROT 5.7* 6.0*  ALBUMIN 2.5* 2.6*   No results for input(s): LIPASE, AMYLASE in the last 168 hours. No results for input(s): AMMONIA in the last 168 hours.  Cardiac Enzymes: No results for input(s): CKTOTAL, CKMB, CKMBINDEX, TROPONINI in the last 168 hours. BNP (last 3 results) No results for input(s): BNP in the last 8760 hours.  ProBNP (last 3 results) No results for input(s):  PROBNP in the last 8760 hours.    Studies: No results found.  Scheduled Meds: . feeding supplement  1 Container Oral TID BM  . methylPREDNISolone (SOLU-MEDROL) injection  40 mg Intravenous Q12H  . multivitamin with minerals  1 tablet Oral Daily  . norethindrone  1 tablet Oral Daily    Admission status: Inpatient: Based on patients clinical presentation and evaluation of above clinical data, I have made determination that patient meets Inpatient criteria at this time.  Time spent: 20 min  Meredeth IdeGagan S Ceana Fiala   [facility] Pager [phone]. If 7PM-7AM, please contact night-coverage at [email or web address], Office  [phone]  password [email or web address]  11/13/2018,  1:23 PM  LOS: 2 days

## 2018-11-13 NOTE — Plan of Care (Signed)
  Problem: Education: Goal: Ability to identify signs and symptoms of gastrointestinal bleeding will improve Outcome: Progressing   Problem: Bowel/Gastric: Goal: Will show no signs and symptoms of gastrointestinal bleeding Outcome: Progressing   Problem: Bowel/Gastric: Goal: Will show no signs and symptoms of gastrointestinal bleeding Outcome: Progressing   Problem: Fluid Volume: Goal: Will show no signs and symptoms of excessive bleeding Outcome: Progressing   Problem: Clinical Measurements: Goal: Complications related to the disease process, condition or treatment will be avoided or minimized Outcome: Progressing

## 2018-11-13 NOTE — Progress Notes (Signed)
Stool sample send for c-diff test

## 2018-11-14 LAB — CBC
HCT: 33.3 % — ABNORMAL LOW (ref 36.0–46.0)
Hemoglobin: 10.5 g/dL — ABNORMAL LOW (ref 12.0–15.0)
MCH: 28.2 pg (ref 26.0–34.0)
MCHC: 31.5 g/dL (ref 30.0–36.0)
MCV: 89.3 fL (ref 80.0–100.0)
Platelets: 354 10*3/uL (ref 150–400)
RBC: 3.73 MIL/uL — ABNORMAL LOW (ref 3.87–5.11)
RDW: 14.3 % (ref 11.5–15.5)
WBC: 8.3 10*3/uL (ref 4.0–10.5)
nRBC: 0 % (ref 0.0–0.2)

## 2018-11-14 MED ORDER — METHYLPREDNISOLONE SODIUM SUCC 125 MG IJ SOLR
80.0000 mg | Freq: Two times a day (BID) | INTRAMUSCULAR | Status: DC
  Administered 2018-11-15 – 2018-11-17 (×5): 80 mg via INTRAVENOUS
  Filled 2018-11-14 (×5): qty 2

## 2018-11-14 NOTE — Progress Notes (Signed)
[facility] Progress Note  Subjective: The patient is seen in regards to her ongoing problem with ulcerative colitis.  She continues to remain symptomatic with diarrhea and rectal bleeding.  Objective: Vital signs in last 24 hours: Temp:  [97.7 F (36.5 C)-98.7 F (37.1 C)] 98.5 F (36.9 C) (06/12 0929) Pulse Rate:  [58-64] 64 (06/12 0929) Resp:  [16-18] 16 (06/12 0549) BP: (92-116)/(51-57) 116/51 (06/12 0929) SpO2:  [99 %-100 %] 100 % (06/12 0929) Weight change:    PE:  No acute distress  Heart regular rhythm  Lungs clear  Abdomen soft nontender  Lab Results: Results for orders placed or performed during the hospital encounter of 11/11/18 (from the past 24 hour(s))  CBC     Status: Abnormal   Collection Time: 11/14/18  4:48 AM  Result Value Ref Range   WBC 8.3 4.0 - 10.5 K/uL   RBC 3.73 (L) 3.87 - 5.11 MIL/uL   Hemoglobin 10.5 (L) 12.0 - 15.0 g/dL   HCT 33.3 (L) 36.0 - 46.0 %   MCV 89.3 80.0 - 100.0 fL   MCH 28.2 26.0 - 34.0 pg   MCHC 31.5 30.0 - 36.0 g/dL   RDW 14.3 11.5 - 15.5 %   Platelets 354 150 - 400 K/uL   nRBC 0.0 0.0 - 0.2 %    Studies/Results: No results found.    Assessment: Ulcerative colitis with flareup.  Continued diarrhea and rectal bleeding.  Plan:   Continue current management but I will increase her IV Solu-Medrol dosage.  Follow clinically.    Cassell Clement 11/14/2018, 10:43 AM  Pager: [phone] If no answer or after 5 PM call [phone]

## 2018-11-14 NOTE — Plan of Care (Signed)
  Problem: Education: Goal: Ability to identify signs and symptoms of gastrointestinal bleeding will improve Outcome: Progressing   Problem: Bowel/Gastric: Goal: Will show no signs and symptoms of gastrointestinal bleeding Outcome: Progressing   Problem: Fluid Volume: Goal: Will show no signs and symptoms of excessive bleeding Outcome: Progressing   

## 2018-11-14 NOTE — Progress Notes (Signed)
Tele standby at 2135 so patient could shower. Returned to tele at 2310.  IV team consulted; IV leaking. Removed IV at 2127. Attempted to administer solumedrol at 0033 after new left forearm IV placed.no blood return. Painful burning with flushing. Solumedrol held. Consulted IV team to assess site at 0049  Patient report 2 BM overnight. 1 was loose/bloody stool. Informed patient to inform RN of next BM to assess stool

## 2018-11-14 NOTE — Progress Notes (Signed)
[facility]  PROGRESS NOTE  Connie Craig ZOX:[id number] DOB: 05/25/1966 DOA: 11/11/2018 PCP: Blair HeysEhinger, Robert, MD   Brief HPI:   53 year old female with recent diagnosis of ulcerative colitis, obesity, asthma, arthritis came with rectal bleeding with bloody diarrhea for about 4 months.  She was seen by GI 2 weeks ago underwent colonoscopy at that time pathology revealed findings consistent with ulcerative colitis.  Patient was started on prednisone 40 mg p.o. daily.  Patient states that she did not feel better and continued to have 1520 watery bloody bowel movements.  Patient's hemoglobin dropped to 8.6, previous hemoglobin was 13.4 on 10/06/2018.    Subjective   Patient seen and examined, denies abdominal pain.  Continues to have bloody bowel movements.  Hemoglobin stable at 10.5.   Assessment/Plan:     .   1. Symptomatic anemia-patient's hemoglobin has dropped from 13.4-8.6.  She is status post 1 unit PRBC.  Today hemoglobin is 10.2.  Follow CBC in a.m. and transfuse for hemoglobin less than 7.    2. GI bleed from ulcerative colitis flareup-started on IV Solu-Medrol 40 mg every 12 hours.  GI is following.  Dose of IV Solu-Medrol changed from 40 mg every 12 hours to 80 mg q. 12 hr. C. difficile PCR is negative.  3. Moderate malnutrition-albumin 2.5, likely from poor p.o. intake from ulcerative colitis.  Hopefully should improve after improvement of inflammation with IV steroids as well.     CBC: Recent Labs  Lab 11/11/18 1432  11/12/18 0308 11/12/18 0954 11/12/18 1446 11/12/18 2120 11/13/18 0334 11/14/18 0448  WBC 5.8  --  8.2  --   --   --  7.7 8.3  NEUTROABS 4.1  --   --   --   --   --   --   --   HGB 8.6*   < > 10.2* 10.0* 10.1* 10.2* 10.2* 10.5*  HCT 26.6*   < > 31.2* 30.7* 31.4* 31.3* 31.8* 33.3*  MCV 89.3  --  87.9  --   --   --  89.1 89.3  PLT 344  --  384  --   --   --  385 354   < > = values in this interval not displayed.    Basic Metabolic  Panel: Recent Labs  Lab 11/11/18 1432 11/11/18 1519 11/12/18 0308 11/13/18 0334  NA 136  --  137 138  K 4.0  --  4.0 5.1  CL 106  --  105 108  CO2 21*  --  24 21*  GLUCOSE 164*  --  140* 141*  BUN 8  --  7 12  CREATININE 1.16*  --  1.00 1.03*  CALCIUM 8.0*  --  8.2* 8.3*  MG  --  2.2  --   --      DVT prophylaxis: SCDs  Code Status: Full code  Family Communication: No family at bedside  Disposition Plan: likely home once diarrhea resolves.     Consultants:  Gastroenterology  Procedures:     Antibiotics:   Anti-infectives (From admission, onward)   None       Objective   Vitals:   11/13/18 1311 11/13/18 2118 11/14/18 0549 11/14/18 0929  BP: (!) 92/54 (!) 104/57 (!) 95/53 (!) 116/51  Pulse: (!) 58 60 (!) 58 64  Resp: 18 16 16    Temp: 98.6 F (37 C) 97.7 F (36.5 C) 98.7 F (37.1 C) 98.5 F (36.9 C)  TempSrc: Oral Oral Oral Oral  SpO2:  99% 100% 100% 100%  Weight:      Height:        Intake/Output Summary (Last 24 hours) at 11/14/2018 1157 Last data filed at 11/14/2018 0900 Gross per 24 hour  Intake 260 ml  Output -  Net 260 ml   Filed Weights   11/11/18 2200  Weight: 98.7 kg     Physical Examination:  General-appears in no acute distress Heart-S1-S2, regular, no murmur auscultated Lungs-clear to auscultation bilaterally, no wheezing or crackles auscultated Abdomen-soft, mild tenderness in epigastric region.  No rigidity or guarding elicited.  No palpable masses. Extremities-no edema in the lower extremities Neuro-alert, oriented x3, no focal deficit noted  Data Reviewed: I have personally reviewed following labs and imaging studies   Recent Results (from the past 240 hour(s))  SARS Coronavirus 2     Status: None   Collection Time: 11/11/18  3:55 PM  Result Value Ref Range Status   SARS Coronavirus 2 NOT DETECTED NOT DETECTED Final    Comment: (NOTE) SARS-CoV-2 target nucleic acids are NOT DETECTED. The SARS-CoV-2 RNA is  generally detectable in upper and lower respiratory specimens during the acute phase of infection.  Negative  results do not preclude SARS-CoV-2 infection, do not rule out co-infections with other pathogens, and should not be used as the sole basis for treatment or other patient management decisions.  Negative results must be combined with clinical observations, patient history, and epidemiological information. The expected result is Not Detected. Fact Sheet for Patients: http://www.biofiredefense.com/wp-content/uploads/2020/03/BIOFIRE-COVID -19-patients.pdf Fact Sheet for Healthcare Providers: http://www.biofiredefense.com/wp-content/uploads/2020/03/BIOFIRE-COVID -19-hcp.pdf This test is not yet approved or cleared by the [place] FDA and  has been authorized for detection and/or diagnosis of SARS-CoV-2 by FDA under an Emergency Use Authorization (EUA).  This EUA will remain in effec t (meaning this test can be used) for the duration of  the COVID-19 declaration under Section 564(b)(1) of the Act, 21 U.S.C. section 360bbb-3(b)(1), unless the authorization is terminated or revoked sooner. Performed at [facility], [address] [place], [place] [address]   C difficile quick scan w PCR reflex     Status: None   Collection Time: 11/13/18  4:26 AM   Specimen: STOOL  Result Value Ref Range Status   C Diff antigen NEGATIVE NEGATIVE Final   C Diff toxin NEGATIVE NEGATIVE Final   C Diff interpretation No C. difficile detected.  Final    Comment: Performed at [facility], [address] [place], [place] [address]     Liver Function Tests: Recent Labs  Lab 11/11/18 1432 11/13/18 0334  AST 15 19  ALT 22 20  ALKPHOS 50 50  BILITOT 0.4 0.5  PROT 5.7* 6.0*  ALBUMIN 2.5* 2.6*   No results for input(s): LIPASE, AMYLASE in the last 168 hours. No results for input(s): AMMONIA in the last 168 hours.  Cardiac Enzymes: No results for input(s): CKTOTAL, CKMB,  CKMBINDEX, TROPONINI in the last 168 hours. BNP (last 3 results) No results for input(s): BNP in the last 8760 hours.  ProBNP (last 3 results) No results for input(s): PROBNP in the last 8760 hours.    Studies: No results found.  Scheduled Meds: . feeding supplement  1 Container Oral TID BM  . methylPREDNISolone (SOLU-MEDROL) injection  80 mg Intravenous Q12H  . multivitamin with minerals  1 tablet Oral Daily    Admission status: Inpatient: Based on patients clinical presentation and evaluation of above clinical data, I have made determination that patient meets  Inpatient criteria at this time.  Time spent: 20 min  [facility] Pager [phone]. If 7PM-7AM, please contact night-coverage at [email or web address], Office  [phone]  password [facility]  11/14/2018, 11:57 AM  LOS: 3 days

## 2018-11-15 LAB — CBC
HCT: 30.5 % — ABNORMAL LOW (ref 36.0–46.0)
Hemoglobin: 9.6 g/dL — ABNORMAL LOW (ref 12.0–15.0)
MCH: 28 pg (ref 26.0–34.0)
MCHC: 31.5 g/dL (ref 30.0–36.0)
MCV: 88.9 fL (ref 80.0–100.0)
Platelets: 313 10*3/uL (ref 150–400)
RBC: 3.43 MIL/uL — ABNORMAL LOW (ref 3.87–5.11)
RDW: 14.1 % (ref 11.5–15.5)
WBC: 8.1 10*3/uL (ref 4.0–10.5)
nRBC: 0 % (ref 0.0–0.2)

## 2018-11-15 LAB — TYPE AND SCREEN
ABO/RH(D): A POS
Antibody Screen: NEGATIVE
Unit division: 0
Unit division: 0
Unit division: 0

## 2018-11-15 LAB — BPAM RBC
Blood Product Expiration Date: 202006262359
Blood Product Expiration Date: 202006262359
Blood Product Expiration Date: 202006262359
ISSUE DATE / TIME: 202006091830
Unit Type and Rh: 6200
Unit Type and Rh: 6200
Unit Type and Rh: 6200

## 2018-11-15 LAB — BASIC METABOLIC PANEL
Anion gap: 9 (ref 5–15)
BUN: 7 mg/dL (ref 6–20)
CO2: 23 mmol/L (ref 22–32)
Calcium: 8.4 mg/dL — ABNORMAL LOW (ref 8.9–10.3)
Chloride: 106 mmol/L (ref 98–111)
Creatinine, Ser: 1.04 mg/dL — ABNORMAL HIGH (ref 0.44–1.00)
GFR calc Af Amer: >60 mL/min (ref >60)
GFR calc non Af Amer: >60 mL/min (ref >60)
Glucose, Bld: 97 mg/dL (ref 70–99)
Potassium: 4 mmol/L (ref 3.5–5.1)
Sodium: 138 mmol/L (ref 135–145)

## 2018-11-15 MED ORDER — MESALAMINE 4 G RE ENEM
4.0000 g | ENEMA | Freq: Every day | RECTAL | Status: DC
  Administered 2018-11-15 – 2018-11-17 (×3): 4 g via RECTAL
  Filled 2018-11-15 (×3): qty 60

## 2018-11-15 MED ORDER — MESALAMINE 1.2 G PO TBEC
4.8000 g | DELAYED_RELEASE_TABLET | Freq: Every day | ORAL | Status: DC
  Administered 2018-11-16 – 2018-11-18 (×3): 4.8 g via ORAL
  Filled 2018-11-15 (×3): qty 4

## 2018-11-15 MED ORDER — SODIUM CHLORIDE 0.9 % IV SOLN
INTRAVENOUS | Status: DC
  Administered 2018-11-15 – 2018-11-17 (×6): via INTRAVENOUS

## 2018-11-15 NOTE — Progress Notes (Signed)
Subjective: Continues to have multiple episodes of bloody bowel movement although less in frequency in the last 24 hours, she needs to have intermittent lower abdominal pain.  Objective: Vital signs in last 24 hours: Temp:  [97.8 F (36.6 C)-98.5 F (36.9 C)] 97.8 F (36.6 C) (06/13 0318) Pulse Rate:  [53-63] 53 (06/13 0318) Resp:  [16-17] 16 (06/13 0318) BP: (106-116)/(50-62) 106/60 (06/13 0318) SpO2:  [99 %-100 %] 100 % (06/13 0318) Weight:  [99 kg-102.4 kg] 99 kg (06/13 0720) Weight change:  Last BM Date: 11/14/18(loose/bloody)  PE: Overweight, mild pallor GENERAL: No signs of dehydration, not in distress ABDOMEN: Soft, nondistended, nontender EXTREMITIES: No deformity  Lab Results: Results for orders placed or performed during the hospital encounter of 11/11/18 (from the past 48 hour(s))  CBC     Status: Abnormal   Collection Time: 11/14/18  4:48 AM  Result Value Ref Range   WBC 8.3 4.0 - 10.5 K/uL   RBC 3.73 (L) 3.87 - 5.11 MIL/uL   Hemoglobin 10.5 (L) 12.0 - 15.0 g/dL   HCT 33.3 (L) 36.0 - 46.0 %   MCV 89.3 80.0 - 100.0 fL   MCH 28.2 26.0 - 34.0 pg   MCHC 31.5 30.0 - 36.0 g/dL   RDW 14.3 11.5 - 15.5 %   Platelets 354 150 - 400 K/uL   nRBC 0.0 0.0 - 0.2 %    Comment: Performed at [facility], [place] [address], [place], [place] [address]  CBC     Status: Abnormal   Collection Time: 11/15/18  3:04 AM  Result Value Ref Range   WBC 8.1 4.0 - 10.5 K/uL   RBC 3.43 (L) 3.87 - 5.11 MIL/uL   Hemoglobin 9.6 (L) 12.0 - 15.0 g/dL   HCT 30.5 (L) 36.0 - 46.0 %   MCV 88.9 80.0 - 100.0 fL   MCH 28.0 26.0 - 34.0 pg   MCHC 31.5 30.0 - 36.0 g/dL   RDW 14.1 11.5 - 15.5 %   Platelets 313 150 - 400 K/uL   nRBC 0.0 0.0 - 0.2 %    Comment: Performed at [facility], [place] [address], [place], [place] [address]  Basic metabolic panel     Status: Abnormal   Collection Time: 11/15/18  3:04 AM  Result Value Ref Range   Sodium 138 135 - 145 mmol/L   Potassium 4.0 3.5 - 5.1  mmol/L    Comment: DELTA CHECK NOTED   Chloride 106 98 - 111 mmol/L   CO2 23 22 - 32 mmol/L   Glucose, Bld 97 70 - 99 mg/dL   BUN 7 6 - 20 mg/dL   Creatinine, Ser 1.04 (H) 0.44 - 1.00 mg/dL   Calcium 8.4 (L) 8.9 - 10.3 mg/dL   GFR calc non Af Amer >60 >60 mL/min   GFR calc Af Amer >60 >60 mL/min   Anion gap 9 5 - 15    Comment: Performed at [facility], [place] [address], [place], [place] [address]    Studies/Results: No results found.  Medications: I have reviewed the patient's current medications.  Assessment: Newly diagnosed ulcerative colitis based on colonoscopy from 10/29/2018. Biopsies from recent colonoscopy showed changes compatible with colitis from rectum to descending colon, full colonoscopy was not performed due to severity of inflammation. Outpatient labs showed elevated ESR of 61, CRP of 33, hemoglobin 9, low iron saturation of 6%, low total protein of 5.9 and low albumin of 3.2.  Patient has been  on adalimumab for 2 years, was recently on [facility]) was discontinued in 06/2018, has been on Xeljanz, methotrexate and hydroxychloroquine for rheumatoid arthritis.  She was supposed to be started on mesalamine, however had to be admitted with uncontrolled symptoms of rectal bleeding and abdominal pain, worsening diarrhea before she was able to pick up prescription. Was on prednisone 40 mg for almost 2 weeks prior to her presentation to the ER.  Plan: Start patient on oral mesalamine 4.8 g, will start on Rowasa enema as well. Patient on high-dose steroids, Solu-Medrol 80 mg every 12 hours.  We will send hepatitis B surface antigen and TB Gold testing. If there is no improvement in symptoms, will have to consider other biologics such as Remicade/vedolizumab/ustekinumab.  Continue full liquid diet for now.    Ronnette Juniper 11/15/2018, 12:04 PM   Pager [phone] If no answer or after 5 PM call [phone]

## 2018-11-15 NOTE — Progress Notes (Signed)
[facility]  PROGRESS NOTE  Connie Craig [id number]:[id number] DOB: [birth year] DOA: 11/11/2018 PCP: Gaynelle Arabian, MD   Brief HPI:   53 year old female with recent diagnosis of ulcerative colitis, obesity, asthma, arthritis came with rectal bleeding with bloody diarrhea for about 4 months.  She was seen by GI 2 weeks ago underwent colonoscopy at that time pathology revealed findings consistent with ulcerative colitis.  Patient was started on prednisone 40 mg p.o. daily.  Patient states that she did not feel better and continued to have 1520 watery bloody bowel movements.  Patient's hemoglobin dropped to 8.6, previous hemoglobin was 13.4 on 10/06/2018.    Subjective   Patient seen and examined, continues to have bloody bowel movements.  The frequency has decreased in past 24 hours.   Assessment/Plan:    1. Symptomatic anemia-patient's hemoglobin has dropped from 13.4-8.6.  She is status post 1 unit PRBC.  Today hemoglobin is 9.6.  Follow CBC in a.m. and transfuse for hemoglobin less than 7.    2. GI bleed from ulcerative colitis flareup-started on IV Solu-Medrol 40 mg every 12 hours.  GI is following.  Dose of IV Solu-Medrol changed from 40 mg every 12 hours to 80 mg q. 12 hr. C. difficile PCR is negative.  Started on mesalamine 4.8 g daily, Rowasa enema daily at bedtime.  3. Moderate malnutrition-albumin 2.5, likely from poor p.o. intake from ulcerative colitis.  Hopefully should improve after improvement of inflammation with IV steroids as well.     CBC: Recent Labs  Lab 11/11/18 1432  11/12/18 0308  11/12/18 1446 11/12/18 2120 11/13/18 0334 11/14/18 0448 11/15/18 0304  WBC 5.8  --  8.2  --   --   --  7.7 8.3 8.1  NEUTROABS 4.1  --   --   --   --   --   --   --   --   HGB 8.6*   < > 10.2*   < > 10.1* 10.2* 10.2* 10.5* 9.6*  HCT 26.6*   < > 31.2*   < > 31.4* 31.3* 31.8* 33.3* 30.5*  MCV 89.3  --  87.9  --   --   --  89.1 89.3 88.9  PLT 344  --  384  --   --   --  385 354  313   < > = values in this interval not displayed.    Basic Metabolic Panel: Recent Labs  Lab 11/11/18 1432 11/11/18 1519 11/12/18 0308 11/13/18 0334 11/15/18 0304  NA 136  --  137 138 138  K 4.0  --  4.0 5.1 4.0  CL 106  --  105 108 106  CO2 21*  --  24 21* 23  GLUCOSE 164*  --  140* 141* 97  BUN 8  --  7 12 7   CREATININE 1.16*  --  1.00 1.03* 1.04*  CALCIUM 8.0*  --  8.2* 8.3* 8.4*  MG  --  2.2  --   --   --      DVT prophylaxis: SCDs  Code Status: Full code  Family Communication: No family at bedside  Disposition Plan: likely home once diarrhea resolves.     Consultants:  Gastroenterology  Procedures:     Antibiotics:   Anti-infectives (From admission, onward)   None       Objective   Vitals:   11/15/18 0318 11/15/18 0555 11/15/18 0720 11/15/18 1348  BP: 106/60   (!) 120/50  Pulse: (!) 53   67  Resp: 16   20  Temp: 97.8 F (36.6 C)   97.7 F (36.5 C)  TempSrc: Oral   Oral  SpO2: 100%   98%  Weight:  102.4 kg 99 kg   Height:        Intake/Output Summary (Last 24 hours) at 11/15/2018 1354 Last data filed at 11/14/2018 1421 Gross per 24 hour  Intake 260 ml  Output -  Net 260 ml   Filed Weights   11/11/18 2200 11/15/18 0555 11/15/18 0720  Weight: 98.7 kg 102.4 kg 99 kg     Physical Examination:  General-appears in no acute distress Heart-S1-S2, regular, no murmur auscultated Lungs-clear to auscultation bilaterally, no wheezing or crackles auscultated Abdomen-soft, nontender, no organomegaly Extremities-no edema in the lower extremities Neuro-alert, oriented x3, no focal deficit noted  Data Reviewed: I have personally reviewed following labs and imaging studies   Recent Results (from the past 240 hour(s))  SARS Coronavirus 2     Status: None   Collection Time: 11/11/18  3:55 PM  Result Value Ref Range Status   SARS Coronavirus 2 NOT DETECTED NOT DETECTED Final    Comment: (NOTE) SARS-CoV-2 target nucleic acids are NOT  DETECTED. The SARS-CoV-2 RNA is generally detectable in upper and lower respiratory specimens during the acute phase of infection.  Negative  results do not preclude SARS-CoV-2 infection, do not rule out co-infections with other pathogens, and should not be used as the sole basis for treatment or other patient management decisions.  Negative results must be combined with clinical observations, patient history, and epidemiological information. The expected result is Not Detected. Fact Sheet for Patients: http://www.biofiredefense.com/wp-content/uploads/2020/03/BIOFIRE-COVID -19-patients.pdf Fact Sheet for Healthcare Providers: http://www.biofiredefense.com/wp-content/uploads/2020/03/BIOFIRE-COVID -19-hcp.pdf This test is not yet approved or cleared by the [place] FDA and  has been authorized for detection and/or diagnosis of SARS-CoV-2 by FDA under an Emergency Use Authorization (EUA).  This EUA will remain in effec t (meaning this test can be used) for the duration of  the COVID-19 declaration under Section 564(b)(1) of the Act, 21 U.S.C. section 360bbb-3(b)(1), unless the authorization is terminated or revoked sooner. Performed at [facility], [address] [place], [place] [address]   C difficile quick scan w PCR reflex     Status: None   Collection Time: 11/13/18  4:26 AM   Specimen: STOOL  Result Value Ref Range Status   C Diff antigen NEGATIVE NEGATIVE Final   C Diff toxin NEGATIVE NEGATIVE Final   C Diff interpretation No C. difficile detected.  Final    Comment: Performed at [facility], [address] [place], [place] [address]     Liver Function Tests: Recent Labs  Lab 11/11/18 1432 11/13/18 0334  AST 15 19  ALT 22 20  ALKPHOS 50 50  BILITOT 0.4 0.5  PROT 5.7* 6.0*  ALBUMIN 2.5* 2.6*   No results for input(s): LIPASE, AMYLASE in the last 168 hours. No results for input(s): AMMONIA in the last 168 hours.  Cardiac Enzymes: No results  for input(s): CKTOTAL, CKMB, CKMBINDEX, TROPONINI in the last 168 hours. BNP (last 3 results) No results for input(s): BNP in the last 8760 hours.  ProBNP (last 3 results) No results for input(s): PROBNP in the last 8760 hours.    Studies: No results found.  Scheduled Meds: . feeding supplement  1 Container Oral TID BM  . [START ON 11/16/2018] mesalamine  4.8 g Oral Q breakfast  . mesalamine  4 g Rectal QHS  .  methylPREDNISolone (SOLU-MEDROL) injection  80 mg Intravenous Q12H  . multivitamin with minerals  1 tablet Oral Daily    Admission status: Inpatient: Based on patients clinical presentation and evaluation of above clinical data, I have made determination that patient meets Inpatient criteria at this time.  Time spent: 20 min  Meredeth IdeGagan S Andrik Sandt   [facility] Pager [phone]. If 7PM-7AM, please contact night-coverage at [email or web address], Office  [phone]  password [email or web address]  11/15/2018, 1:54 PM  LOS: 4 days

## 2018-11-15 NOTE — Progress Notes (Addendum)
MD GI paged for clarification of enema order of mesalamine considering patient contines to have diarrheal stools. Po order form of med is to be given in am. Awaiting call back   2116- Paged [facility] about mesalamine order. No call back from GI. Awaiting response at present.   2130- [facility] MD responded. Med given as ordered.

## 2018-11-16 LAB — CBC
HCT: 31.8 % — ABNORMAL LOW (ref 36.0–46.0)
Hemoglobin: 10.4 g/dL — ABNORMAL LOW (ref 12.0–15.0)
MCH: 28.9 pg (ref 26.0–34.0)
MCHC: 32.7 g/dL (ref 30.0–36.0)
MCV: 88.3 fL (ref 80.0–100.0)
Platelets: 335 10*3/uL (ref 150–400)
RBC: 3.6 MIL/uL — ABNORMAL LOW (ref 3.87–5.11)
RDW: 13.8 % (ref 11.5–15.5)
WBC: 7.5 10*3/uL (ref 4.0–10.5)
nRBC: 0 % (ref 0.0–0.2)

## 2018-11-16 NOTE — Progress Notes (Signed)
[facility]  PROGRESS NOTE  Connie Craig GYJ:[id number] DOB: [birth year] DOA: 11/11/2018 PCP: Gaynelle Arabian, MD   Brief HPI:   53 year old female with recent diagnosis of ulcerative colitis, obesity, asthma, arthritis came with rectal bleeding with bloody diarrhea for about 4 months.  She was seen by GI 2 weeks ago underwent colonoscopy at that time pathology revealed findings consistent with ulcerative colitis.  Patient was started on prednisone 40 mg p.o. daily.  Patient states that she did not feel better and continued to have 1520 watery bloody bowel movements.  Patient's hemoglobin dropped to 8.6, previous hemoglobin was 13.4 on 10/06/2018.    Subjective   Patient seen and examined, diarrhea is slowing down.  Hemoglobin stable at 10.4   Assessment/Plan:    1. Symptomatic anemia-patient's hemoglobin has dropped from 13.4 -  8.6.  She is status post 1 unit PRBC.  Today hemoglobin is 9.6.  Follow CBC in a.m. and transfuse for hemoglobin less than 7.    2. GI bleed from ulcerative colitis flareup-started on IV Solu-Medrol 40 mg every 12 hours.  GI is following.  Dose of IV Solu-Medrol changed from 40 mg every 12 hours to 80 mg q. 12 hr. C. difficile PCR is negative.  Started on mesalamine 4.8 g daily, Rowasa enema daily at bedtime.  3. Moderate malnutrition-albumin 2.5, likely from poor p.o. intake from ulcerative colitis.  Hopefully should improve after improvement of inflammation with IV steroids as well.     CBC: Recent Labs  Lab 11/11/18 1432  11/12/18 0308  11/12/18 2120 11/13/18 0334 11/14/18 0448 11/15/18 0304 11/16/18 0331  WBC 5.8  --  8.2  --   --  7.7 8.3 8.1 7.5  NEUTROABS 4.1  --   --   --   --   --   --   --   --   HGB 8.6*   < > 10.2*   < > 10.2* 10.2* 10.5* 9.6* 10.4*  HCT 26.6*   < > 31.2*   < > 31.3* 31.8* 33.3* 30.5* 31.8*  MCV 89.3  --  87.9  --   --  89.1 89.3 88.9 88.3  PLT 344  --  384  --   --  385 354 313 335   < > = values in this interval  not displayed.    Basic Metabolic Panel: Recent Labs  Lab 11/11/18 1432 11/11/18 1519 11/12/18 0308 11/13/18 0334 11/15/18 0304  NA 136  --  137 138 138  K 4.0  --  4.0 5.1 4.0  CL 106  --  105 108 106  CO2 21*  --  24 21* 23  GLUCOSE 164*  --  140* 141* 97  BUN 8  --  7 12 7   CREATININE 1.16*  --  1.00 1.03* 1.04*  CALCIUM 8.0*  --  8.2* 8.3* 8.4*  MG  --  2.2  --   --   --      DVT prophylaxis: SCDs  Code Status: Full code  Family Communication: No family at bedside  Disposition Plan: likely home once diarrhea resolves.     Consultants:  Gastroenterology  Procedures:     Antibiotics:   Anti-infectives (From admission, onward)   None       Objective   Vitals:   11/15/18 2351 11/16/18 0412 11/16/18 0539 11/16/18 0841  BP: (!) 105/52 (!) 108/48  123/63  Pulse: (!) 54 (!) 50  64  Resp: 18 18  18  Temp: 98.4 F (36.9 C) 98.2 F (36.8 C)  98.5 F (36.9 C)  TempSrc: Oral   Oral  SpO2: 99% 99%  100%  Weight:   98.4 kg   Height:        Intake/Output Summary (Last 24 hours) at 11/16/2018 1246 Last data filed at 11/16/2018 1126 Gross per 24 hour  Intake 1942.07 ml  Output -  Net 1942.07 ml   Filed Weights   11/15/18 0555 11/15/18 0720 11/16/18 0539  Weight: 102.4 kg 99 kg 98.4 kg     Physical Examination:  General-appears in no acute distress Heart-S1-S2, regular, no murmur auscultated Lungs-clear to auscultation bilaterally, no wheezing or crackles auscultated Abdomen-soft, nontender, no organomegaly Extremities-no edema in the lower extremities Neuro-alert, oriented x3, no focal deficit noted  Data Reviewed: I have personally reviewed following labs and imaging studies   Recent Results (from the past 240 hour(s))  SARS Coronavirus 2     Status: None   Collection Time: 11/11/18  3:55 PM  Result Value Ref Range Status   SARS Coronavirus 2 NOT DETECTED NOT DETECTED Final    Comment: (NOTE) SARS-CoV-2 target nucleic acids are NOT  DETECTED. The SARS-CoV-2 RNA is generally detectable in upper and lower respiratory specimens during the acute phase of infection.  Negative  results do not preclude SARS-CoV-2 infection, do not rule out co-infections with other pathogens, and should not be used as the sole basis for treatment or other patient management decisions.  Negative results must be combined with clinical observations, patient history, and epidemiological information. The expected result is Not Detected. Fact Sheet for Patients: http://www.biofiredefense.com/wp-content/uploads/2020/03/BIOFIRE-COVID -19-patients.pdf Fact Sheet for Healthcare Providers: http://www.biofiredefense.com/wp-content/uploads/2020/03/BIOFIRE-COVID -19-hcp.pdf This test is not yet approved or cleared by the [place] FDA and  has been authorized for detection and/or diagnosis of SARS-CoV-2 by FDA under an Emergency Use Authorization (EUA).  This EUA will remain in effec t (meaning this test can be used) for the duration of  the COVID-19 declaration under Section 564(b)(1) of the Act, 21 U.S.C. section 360bbb-3(b)(1), unless the authorization is terminated or revoked sooner. Performed at [facility], [address] [place], [place] [address]   C difficile quick scan w PCR reflex     Status: None   Collection Time: 11/13/18  4:26 AM   Specimen: STOOL  Result Value Ref Range Status   C Diff antigen NEGATIVE NEGATIVE Final   C Diff toxin NEGATIVE NEGATIVE Final   C Diff interpretation No C. difficile detected.  Final    Comment: Performed at [facility], [address] [place], [place] [address]     Liver Function Tests: Recent Labs  Lab 11/11/18 1432 11/13/18 0334  AST 15 19  ALT 22 20  ALKPHOS 50 50  BILITOT 0.4 0.5  PROT 5.7* 6.0*  ALBUMIN 2.5* 2.6*   No results for input(s): LIPASE, AMYLASE in the last 168 hours. No results for input(s): AMMONIA in the last 168 hours.  Cardiac Enzymes: No results  for input(s): CKTOTAL, CKMB, CKMBINDEX, TROPONINI in the last 168 hours. BNP (last 3 results) No results for input(s): BNP in the last 8760 hours.  ProBNP (last 3 results) No results for input(s): PROBNP in the last 8760 hours.    Studies: No results found.  Scheduled Meds: . feeding supplement  1 Container Oral TID BM  . mesalamine  4.8 g Oral Q breakfast  . mesalamine  4 g Rectal QHS  . methylPREDNISolone (SOLU-MEDROL) injection  80 mg  Intravenous Q12H  . multivitamin with minerals  1 tablet Oral Daily    Admission status: Inpatient: Based on patients clinical presentation and evaluation of above clinical data, I have made determination that patient meets Inpatient criteria at this time.  Time spent: 20 min  Meredeth IdeGagan S Lama   [facility] Pager [phone]. If 7PM-7AM, please contact night-coverage at [email or web address], Office  [phone]  password [email or web address]  11/16/2018, 12:46 PM  LOS: 5 days

## 2018-11-16 NOTE — Plan of Care (Signed)

## 2018-11-16 NOTE — Progress Notes (Signed)
Subjective: Patient reports decrease in frequency of bowel movements, semi liquid stool instead of watery and decrease in rectal bleeding.  She also has mild improvement in abdominal pain.  Objective: Vital signs in last 24 hours: Temp:  [97.7 F (36.5 C)-98.5 F (36.9 C)] 98.5 F (36.9 C) (06/14 0841) Pulse Rate:  [50-67] 64 (06/14 0841) Resp:  [18-20] 18 (06/14 0841) BP: (105-123)/(48-63) 123/63 (06/14 0841) SpO2:  [98 %-100 %] 100 % (06/14 0841) Weight:  [98.4 kg] 98.4 kg (06/14 0539) Weight change: -3.4 kg Last BM Date: 11/15/18  PE: Mild pallor GENERAL: Overweight, not in distress ABDOMEN: Mild left lower quadrant tenderness EXTREMITIES: no deformity  Lab Results: Results for orders placed or performed during the hospital encounter of 11/11/18 (from the past 48 hour(s))  CBC     Status: Abnormal   Collection Time: 11/15/18  3:04 AM  Result Value Ref Range   WBC 8.1 4.0 - 10.5 K/uL   RBC 3.43 (L) 3.87 - 5.11 MIL/uL   Hemoglobin 9.6 (L) 12.0 - 15.0 g/dL   HCT 30.5 (L) 36.0 - 46.0 %   MCV 88.9 80.0 - 100.0 fL   MCH 28.0 26.0 - 34.0 pg   MCHC 31.5 30.0 - 36.0 g/dL   RDW 14.1 11.5 - 15.5 %   Platelets 313 150 - 400 K/uL   nRBC 0.0 0.0 - 0.2 %    Comment: Performed at [facility], [place] [address], [place], [place] [address]  Basic metabolic panel     Status: Abnormal   Collection Time: 11/15/18  3:04 AM  Result Value Ref Range   Sodium 138 135 - 145 mmol/L   Potassium 4.0 3.5 - 5.1 mmol/L    Comment: DELTA CHECK NOTED   Chloride 106 98 - 111 mmol/L   CO2 23 22 - 32 mmol/L   Glucose, Bld 97 70 - 99 mg/dL   BUN 7 6 - 20 mg/dL   Creatinine, Ser 1.04 (H) 0.44 - 1.00 mg/dL   Calcium 8.4 (L) 8.9 - 10.3 mg/dL   GFR calc non Af Amer >60 >60 mL/min   GFR calc Af Amer >60 >60 mL/min   Anion gap 9 5 - 15    Comment: Performed at [facility], [place] [address], [place], [place] [address]  CBC     Status: Abnormal   Collection Time: 11/16/18  3:31 AM  Result Value  Ref Range   WBC 7.5 4.0 - 10.5 K/uL   RBC 3.60 (L) 3.87 - 5.11 MIL/uL   Hemoglobin 10.4 (L) 12.0 - 15.0 g/dL   HCT 31.8 (L) 36.0 - 46.0 %   MCV 88.3 80.0 - 100.0 fL   MCH 28.9 26.0 - 34.0 pg   MCHC 32.7 30.0 - 36.0 g/dL   RDW 13.8 11.5 - 15.5 %   Platelets 335 150 - 400 K/uL   nRBC 0.0 0.0 - 0.2 %    Comment: Performed at [facility], [place] [address], [place], [place] [address]    Studies/Results: No results found.  Medications: I have reviewed the patient's current medications.  Assessment: Ulcerative colitis, newly diagnosed  Plan: Advance diet to low fiber Continue mesalamine oral and as well as enemas. Continue high-dose IV steroids, plan to taper in the next 24 hours if more clinical improvement is noted.   Ronnette Juniper 11/16/2018, 12:27 PM   Pager [phone] If no answer or after 5 PM call [phone]

## 2018-11-17 LAB — BASIC METABOLIC PANEL
Anion gap: 8 (ref 5–15)
BUN: 12 mg/dL (ref 6–20)
CO2: 24 mmol/L (ref 22–32)
Calcium: 8.4 mg/dL — ABNORMAL LOW (ref 8.9–10.3)
Chloride: 107 mmol/L (ref 98–111)
Creatinine, Ser: 1.06 mg/dL — ABNORMAL HIGH (ref 0.44–1.00)
GFR calc Af Amer: >60 mL/min (ref >60)
GFR calc non Af Amer: >60 mL/min (ref >60)
Glucose, Bld: 178 mg/dL — ABNORMAL HIGH (ref 70–99)
Potassium: 4.2 mmol/L (ref 3.5–5.1)
Sodium: 139 mmol/L (ref 135–145)

## 2018-11-17 LAB — CBC
HCT: 29.7 % — ABNORMAL LOW (ref 36.0–46.0)
Hemoglobin: 9.3 g/dL — ABNORMAL LOW (ref 12.0–15.0)
MCH: 28.3 pg (ref 26.0–34.0)
MCHC: 31.3 g/dL (ref 30.0–36.0)
MCV: 90.3 fL (ref 80.0–100.0)
Platelets: 308 10*3/uL (ref 150–400)
RBC: 3.29 MIL/uL — ABNORMAL LOW (ref 3.87–5.11)
RDW: 13.7 % (ref 11.5–15.5)
WBC: 9.4 10*3/uL (ref 4.0–10.5)
nRBC: 0 % (ref 0.0–0.2)

## 2018-11-17 MED ORDER — METHYLPREDNISOLONE SODIUM SUCC 40 MG IJ SOLR
40.0000 mg | Freq: Two times a day (BID) | INTRAMUSCULAR | Status: DC
  Administered 2018-11-17 – 2018-11-18 (×2): 40 mg via INTRAVENOUS
  Filled 2018-11-17 (×2): qty 1

## 2018-11-17 MED ORDER — PANTOPRAZOLE SODIUM 40 MG PO TBEC
40.0000 mg | DELAYED_RELEASE_TABLET | Freq: Two times a day (BID) | ORAL | Status: DC
  Administered 2018-11-17 – 2018-11-18 (×3): 40 mg via ORAL
  Filled 2018-11-17 (×3): qty 1

## 2018-11-17 NOTE — Progress Notes (Signed)
[facility]  PROGRESS NOTE  Connie Craig Stay GMW:[id number] DOB: 01/24/1966 DOA: 11/11/2018 PCP: Blair HeysEhinger, Robert, MD   Brief HPI:   53 year old female with recent diagnosis of ulcerative colitis, obesity, asthma, arthritis came with rectal bleeding with bloody diarrhea for about 4 months.  She was seen by GI 2 weeks ago underwent colonoscopy at that time pathology revealed findings consistent with ulcerative colitis.  Patient was started on prednisone 40 mg p.o. daily.  Patient states that she did not feel better and continued to have 1520 watery bloody bowel movements.  Patient's hemoglobin dropped to 8.6, previous hemoglobin was 13.4 on 10/06/2018.    Subjective   Patient seen and examined, feels much better.  Diarrhea has slowed down.   Assessment/Plan:    1. Symptomatic anemia-patient's hemoglobin has dropped from 13.4 -  8.6.  She is status post 1 unit PRBC.  Today hemoglobin is 9.3.  Follow CBC in a.m. and transfuse for hemoglobin less than 7.    2. GI bleed from ulcerative colitis flareup-started on IV Solu-Medrol 40 mg every 12 hours.  GI is following.  Dose of IV Solu-Medrol changed from 40 mg every 12 hours to 80 mg q. 12 hr. C. difficile PCR is negative.  Started on mesalamine 4.8 g daily, Rowasa enema daily at bedtime.  GI following.  Plan for changing IV Solu-Medrol to p.o. prednisone in a.m.  3. Moderate malnutrition-albumin 2.5, likely from poor p.o. intake from ulcerative colitis.  Hopefully should improve after improvement of inflammation with IV steroids as well.     CBC: Recent Labs  Lab 11/11/18 1432  11/13/18 0334 11/14/18 0448 11/15/18 0304 11/16/18 0331 11/17/18 0431  WBC 5.8   < > 7.7 8.3 8.1 7.5 9.4  NEUTROABS 4.1  --   --   --   --   --   --   HGB 8.6*   < > 10.2* 10.5* 9.6* 10.4* 9.3*  HCT 26.6*   < > 31.8* 33.3* 30.5* 31.8* 29.7*  MCV 89.3   < > 89.1 89.3 88.9 88.3 90.3  PLT 344   < > 385 354 313 335 308   < > = values in this interval not  displayed.    Basic Metabolic Panel: Recent Labs  Lab 11/11/18 1432 11/11/18 1519 11/12/18 0308 11/13/18 0334 11/15/18 0304 11/17/18 0431  NA 136  --  137 138 138 139  K 4.0  --  4.0 5.1 4.0 4.2  CL 106  --  105 108 106 107  CO2 21*  --  24 21* 23 24  GLUCOSE 164*  --  140* 141* 97 178*  BUN 8  --  7 12 7 12   CREATININE 1.16*  --  1.00 1.03* 1.04* 1.06*  CALCIUM 8.0*  --  8.2* 8.3* 8.4* 8.4*  MG  --  2.2  --   --   --   --      DVT prophylaxis: SCDs  Code Status: Full code  Family Communication: No family at bedside  Disposition Plan: likely home once diarrhea resolves.     Consultants:  Gastroenterology  Procedures:     Antibiotics:   Anti-infectives (From admission, onward)   None       Objective   Vitals:   11/16/18 1555 11/16/18 2213 11/17/18 0541 11/17/18 1259  BP: 111/69 (!) 101/54  (!) 115/59  Pulse: 68 61  (!) 50  Resp: 19 18  15   Temp:  98.3 F (36.8 C)  99 F (  37.2 C)  TempSrc:  Oral  Oral  SpO2: 100% 97%  99%  Weight:   100.2 kg   Height:        Intake/Output Summary (Last 24 hours) at 11/17/2018 1507 Last data filed at 11/17/2018 0630 Gross per 24 hour  Intake 1759.52 ml  Output -  Net 1759.52 ml   Filed Weights   11/15/18 0720 11/16/18 0539 11/17/18 0541  Weight: 99 kg 98.4 kg 100.2 kg     Physical Examination:  General-appears in no acute distress Heart-S1-S2, regular, no murmur auscultated Lungs-clear to auscultation bilaterally, no wheezing or crackles auscultated Abdomen-soft,  mild epigastric tenderness to palpation, no organomegaly Extremities-no edema in the lower extremities Neuro-alert, oriented x3, no focal deficit noted  Data Reviewed: I have personally reviewed following labs and imaging studies   Recent Results (from the past 240 hour(s))  SARS Coronavirus 2     Status: None   Collection Time: 11/11/18  3:55 PM  Result Value Ref Range Status   SARS Coronavirus 2 NOT DETECTED NOT DETECTED Final     Comment: (NOTE) SARS-CoV-2 target nucleic acids are NOT DETECTED. The SARS-CoV-2 RNA is generally detectable in upper and lower respiratory specimens during the acute phase of infection.  Negative  results do not preclude SARS-CoV-2 infection, do not rule out co-infections with other pathogens, and should not be used as the sole basis for treatment or other patient management decisions.  Negative results must be combined with clinical observations, patient history, and epidemiological information. The expected result is Not Detected. Fact Sheet for Patients: http://www.biofiredefense.com/wp-content/uploads/2020/03/BIOFIRE-COVID -19-patients.pdf Fact Sheet for Healthcare Providers: http://www.biofiredefense.com/wp-content/uploads/2020/03/BIOFIRE-COVID -19-hcp.pdf This test is not yet approved or cleared by the [place] and  has been authorized for detection and/or diagnosis of SARS-CoV-2 by FDA under an Emergency Use Authorization (EUA).  This EUA will remain in effec t (meaning this test can be used) for the duration of  the COVID-19 declaration under Section 564(b)(1) of the Act, 21 U.S.C. section 360bbb-3(b)(1), unless the authorization is terminated or revoked sooner. Performed at [facility], [place] [address], [place], [place] [address]   C difficile quick scan w PCR reflex     Status: None   Collection Time: 11/13/18  4:26 AM   Specimen: STOOL  Result Value Ref Range Status   C Diff antigen NEGATIVE NEGATIVE Final   C Diff toxin NEGATIVE NEGATIVE Final   C Diff interpretation No C. difficile detected.  Final    Comment: Performed at [facility], [place] [address], [place], [place] [address]     Liver Function Tests: Recent Labs  Lab 11/11/18 1432 11/13/18 0334  AST 15 19  ALT 22 20  ALKPHOS 50 50  BILITOT 0.4 0.5  PROT 5.7* 6.0*  ALBUMIN 2.5* 2.6*   No results for input(s): LIPASE, AMYLASE in the last 168 hours. No results for input(s): AMMONIA  in the last 168 hours.  Cardiac Enzymes: No results for input(s): CKTOTAL, CKMB, CKMBINDEX, TROPONINI in the last 168 hours. BNP (last 3 results) No results for input(s): BNP in the last 8760 hours.  ProBNP (last 3 results) No results for input(s): PROBNP in the last 8760 hours.    Studies: No results found.  Scheduled Meds: . feeding supplement  1 Container Oral TID BM  . mesalamine  4.8 g Oral Q breakfast  . mesalamine  4 g Rectal QHS  . methylPREDNISolone (SOLU-MEDROL) injection  40 mg Intravenous Q12H  . multivitamin with minerals  1 tablet Oral Daily  . pantoprazole  40 mg Oral BID AC    Admission status: Inpatient: Based on patients clinical presentation and evaluation of above clinical data, I have made determination that patient meets Inpatient criteria at this time.  Time spent: 20 min  Meredeth IdeGagan S Lark Runk   [facility] Pager [phone]. If 7PM-7AM, please contact night-coverage at [email or web address], Office  [phone]  password [email or web address]  11/17/2018, 3:07 PM  LOS: 6 days

## 2018-11-17 NOTE — Progress Notes (Signed)
Subjective: Patient reports improvement in frequency of bowel movements, states she has had 5 bowel movements since midnight yesterday.  Stool consistency remains loose but is not watery, bleeding has almost resolved as she sees only small specks of blood in stool. There is improvement in lower abdominal pain but she complains of epigastric fullness, she was able to tolerate low fiber low residue diet.  Objective: Vital signs in last 24 hours: Temp:  [98.3 F (36.8 C)-98.6 F (37 C)] 98.3 F (36.8 C) (06/14 2213) Pulse Rate:  [61-68] 61 (06/14 2213) Resp:  [18-19] 18 (06/14 2213) BP: (101-123)/(54-69) 101/54 (06/14 2213) SpO2:  [97 %-100 %] 97 % (06/14 2213) Weight:  [100.2 kg] 100.2 kg (06/15 0541) Weight change: 1.199 kg Last BM Date: 11/17/18  PE: Not in distress GENERAL: Mild pallor ABDOMEN: Soft, nondistended, nontender, normoactive bowel sounds EXTREMITIES: No deformity  Lab Results: Results for orders placed or performed during the hospital encounter of 11/11/18 (from the past 48 hour(s))  CBC     Status: Abnormal   Collection Time: 11/16/18  3:31 AM  Result Value Ref Range   WBC 7.5 4.0 - 10.5 K/uL   RBC 3.60 (L) 3.87 - 5.11 MIL/uL   Hemoglobin 10.4 (L) 12.0 - 15.0 g/dL   HCT 31.8 (L) 36.0 - 46.0 %   MCV 88.3 80.0 - 100.0 fL   MCH 28.9 26.0 - 34.0 pg   MCHC 32.7 30.0 - 36.0 g/dL   RDW 13.8 11.5 - 15.5 %   Platelets 335 150 - 400 K/uL   nRBC 0.0 0.0 - 0.2 %    Comment: Performed at [facility], [place] [address], [place], [place] [address]  CBC     Status: Abnormal   Collection Time: 11/17/18  4:31 AM  Result Value Ref Range   WBC 9.4 4.0 - 10.5 K/uL   RBC 3.29 (L) 3.87 - 5.11 MIL/uL   Hemoglobin 9.3 (L) 12.0 - 15.0 g/dL   HCT 29.7 (L) 36.0 - 46.0 %   MCV 90.3 80.0 - 100.0 fL   MCH 28.3 26.0 - 34.0 pg   MCHC 31.3 30.0 - 36.0 g/dL   RDW 13.7 11.5 - 15.5 %   Platelets 308 150 - 400 K/uL   nRBC 0.0 0.0 - 0.2 %    Comment: Performed at [facility], [place] [address] [place], [place] [address]  Basic metabolic panel     Status: Abnormal   Collection Time: 11/17/18  4:31 AM  Result Value Ref Range   Sodium 139 135 - 145 mmol/L   Potassium 4.2 3.5 - 5.1 mmol/L   Chloride 107 98 - 111 mmol/L   CO2 24 22 - 32 mmol/L   Glucose, Bld 178 (H) 70 - 99 mg/dL   BUN 12 6 - 20 mg/dL   Creatinine, Ser 1.06 (H) 0.44 - 1.00 mg/dL   Calcium 8.4 (L) 8.9 - 10.3 mg/dL   GFR calc non Af Amer >60 >60 mL/min   GFR calc Af Amer >60 >60 mL/min   Anion gap 8 5 - 15    Comment: Performed at [facility], [place] [address], [place], [place] [address]    Studies/Results: No results found.  Medications: I have reviewed the patient's current medications.  Assessment: Newly diagnosed ulcerative colitis Doing well on high-dose IV steroids p.o. mesalamine and mesalamine enemas  Plan: Decrease IV Solu-Medrol to 40 mg every 12 hours,if tolerated well plan to transition to oral steroids  tomorrow. Continue oral and rectal mesalamine. Results of TB Gold and hep B surface antigen pending.   Kerin Salenrya Teddrick Mallari 11/17/2018, 8:32 AM   Pager [phone] If no answer or after 5 PM call [phone]

## 2018-11-18 DIAGNOSIS — K51911 Ulcerative colitis, unspecified with rectal bleeding: Secondary | ICD-10-CM

## 2018-11-18 LAB — BASIC METABOLIC PANEL
Anion gap: 9 (ref 5–15)
BUN: 14 mg/dL (ref 6–20)
CO2: 23 mmol/L (ref 22–32)
Calcium: 8.3 mg/dL — ABNORMAL LOW (ref 8.9–10.3)
Chloride: 105 mmol/L (ref 98–111)
Creatinine, Ser: 1.23 mg/dL — ABNORMAL HIGH (ref 0.44–1.00)
GFR calc Af Amer: 59 mL/min — ABNORMAL LOW (ref >60)
GFR calc non Af Amer: 51 mL/min — ABNORMAL LOW (ref >60)
Glucose, Bld: 216 mg/dL — ABNORMAL HIGH (ref 70–99)
Potassium: 3.8 mmol/L (ref 3.5–5.1)
Sodium: 137 mmol/L (ref 135–145)

## 2018-11-18 LAB — CBC
HCT: 32 % — ABNORMAL LOW (ref 36.0–46.0)
Hemoglobin: 10.1 g/dL — ABNORMAL LOW (ref 12.0–15.0)
MCH: 28.1 pg (ref 26.0–34.0)
MCHC: 31.6 g/dL (ref 30.0–36.0)
MCV: 89.1 fL (ref 80.0–100.0)
Platelets: 328 10*3/uL (ref 150–400)
RBC: 3.59 MIL/uL — ABNORMAL LOW (ref 3.87–5.11)
RDW: 13.8 % (ref 11.5–15.5)
WBC: 8.2 10*3/uL (ref 4.0–10.5)
nRBC: 0 % (ref 0.0–0.2)

## 2018-11-18 MED ORDER — MESALAMINE 1.2 G PO TBEC: 4.8000 g | DELAYED_RELEASE_TABLET | Freq: Every day | ORAL | 3 refills | Status: AC

## 2018-11-18 MED ORDER — PANTOPRAZOLE SODIUM 40 MG PO TBEC: 40.0000 mg | DELAYED_RELEASE_TABLET | Freq: Two times a day (BID) | ORAL | 0 refills | Status: AC

## 2018-11-18 MED ORDER — PREDNISONE 20 MG PO TABS
40.0000 mg | ORAL_TABLET | Freq: Every day | ORAL | Status: DC
  Administered 2018-11-18: 40 mg via ORAL
  Filled 2018-11-18: qty 2

## 2018-11-18 MED ORDER — PREDNISONE 10 MG PO TABS: ORAL_TABLET | ORAL | 0 refills | Status: AC

## 2018-11-18 MED ORDER — MESALAMINE 4 G RE ENEM: 4.0000 g | ENEMA | Freq: Every day | RECTAL | 0 refills | Status: AC

## 2018-11-18 NOTE — Progress Notes (Addendum)
Subjective: She reports improvement in frequency of bowel movements, stools although loose and not watery, blood in stool has improved remarkably, she is able to tolerate solid diet.  Objective: Vital signs in last 24 hours: Temp:  [98.2 F (36.8 C)-99 F (37.2 C)] 98.6 F (37 C) (06/15 2335) Pulse Rate:  [50-57] 57 (06/15 2335) Resp:  [15-16] 16 (06/15 2335) BP: (97-115)/(48-59) 98/52 (06/15 2335) SpO2:  [98 %-99 %] 99 % (06/15 2335) Weight change:  Last BM Date: 11/18/18  PE: Mild pallor, overweight GENERAL: Not in distress  ABDOMEN: non distended EXTREMITIES: no deformity  Lab Results: Results for orders placed or performed during the hospital encounter of 11/11/18 (from the past 48 hour(s))  CBC     Status: Abnormal   Collection Time: 11/17/18  4:31 AM  Result Value Ref Range   WBC 9.4 4.0 - 10.5 K/uL   RBC 3.29 (L) 3.87 - 5.11 MIL/uL   Hemoglobin 9.3 (L) 12.0 - 15.0 g/dL   HCT 29.7 (L) 36.0 - 46.0 %   MCV 90.3 80.0 - 100.0 fL   MCH 28.3 26.0 - 34.0 pg   MCHC 31.3 30.0 - 36.0 g/dL   RDW 13.7 11.5 - 15.5 %   Platelets 308 150 - 400 K/uL   nRBC 0.0 0.0 - 0.2 %    Comment: Performed at [facility], [place] [address] [place], [place] [address]  Basic metabolic panel     Status: Abnormal   Collection Time: 11/17/18  4:31 AM  Result Value Ref Range   Sodium 139 135 - 145 mmol/L   Potassium 4.2 3.5 - 5.1 mmol/L   Chloride 107 98 - 111 mmol/L   CO2 24 22 - 32 mmol/L   Glucose, Bld 178 (H) 70 - 99 mg/dL   BUN 12 6 - 20 mg/dL   Creatinine, Ser 1.06 (H) 0.44 - 1.00 mg/dL   Calcium 8.4 (L) 8.9 - 10.3 mg/dL   GFR calc non Af Amer >60 >60 mL/min   GFR calc Af Amer >60 >60 mL/min   Anion gap 8 5 - 15    Comment: Performed at [facility], [place] [address], [place], [place] [address]    Studies/Results: No results found.  Medications: I have reviewed the patient's current medications.  Assessment: Newly diagnosed ulcerative colitis Doing well on high dose IV  steroids oral and rectal mesalamine Normocytic anemia HBS antigen negative, TB Gold test pending   Plan: As patient has been on adalimumab for 2 years, was recently on [place]) which was discontinued in 06/2018, has been on Xeljanz, methotrexate and hydroxychloroquine for rheumatoid arthritis, if needed as an outpatient we have options of using Remicade/vedolizumab/ustekinumab.  Currently doing well on mesalamine 4.8 g and Rowasa enemas. Okay to DC home from GI standpoint on tapering dose of steroids, prednisone 40 mg daily for a week followed by 30 mg daily for a week, then 20 mg daily for a week and then 10 mg daily for a week, stop thereafter. Patient to be discharged on oral mesalamine, Lialda 1.2 g 4 pills daily(to be continued) and Rowasa enemas 4 g at bedtime for another 2 weeks. Please discharge patient on PPI BID for another 4 weeks. To follow-up with Dr. Paulita Fujita in 1 week.  Ronnette Juniper 11/18/2018, 8:36 AM   Pager [phone] If no answer or after 5 PM call [phone]

## 2018-11-18 NOTE — Progress Notes (Signed)
Patient report 3 BM overnight with minimal blood. 1 BM after receiving enema

## 2018-11-18 NOTE — Discharge Summary (Signed)
Physician Discharge Summary  Connie Craig ZOX:[id number] DOB: 01/29/1966 DOA: 11/11/2018  PCP: Blair HeysEhinger, Robert, MD  Admit date: 11/11/2018 Discharge date: 11/18/2018  Time spent: 40 minutes  Recommendations for Outpatient Follow-up:  1. Follow-up Dr. Dulce Sellarutlaw in 1 week 2. Follow-up with PCP in 2 weeks    Discharge Diagnoses:  Active Problems:   Rectal bleed   Discharge Condition: Stable  Diet recommendation: Regular diet  Filed Weights   11/15/18 0720 11/16/18 0539 11/17/18 0541  Weight: 99 kg 98.4 kg 100.2 kg    History of present illness:  53 year old female with recent diagnosis of ulcerative colitis, obesity, asthma, arthritis came with rectal bleeding with bloody diarrhea for about 4 months.  She was seen by GI 2 weeks ago underwent colonoscopy at that time pathology revealed findings consistent with ulcerative colitis.  Patient was started on prednisone 40 mg p.o. daily.  Patient states that she did not feel better and continued to have 1520 watery bloody bowel movements.  Patient's hemoglobin dropped to 8.6, previous hemoglobin was 13.4 on 10/06/2018.   Hospital Course:   1. Symptomatic anemia-secondary to GI bleed from ulcerative colitis flareup.  Patient's hemoglobin  dropped from 13.4 -  8.6.  She is status post 1 unit PRBC.  Today hemoglobin is 9.3.    2. GI bleed from ulcerative colitis flareup-resolved, started on IV Solu-Medrol 40 mg every 12 hours.  GI was consulted.  Dose of IV Solu-Medrol changed from 40 mg every 12 hours to 80 mg q. 12 hr. C. difficile PCR is negative.  Started on mesalamine 4.8 g daily, Rowasa enema daily at bedtime.   Discharge medications include-prednisone taper 40 mg daily for 7 days then 30 mg daily for 7 days then 20 mg daily for 7 days then 10 mg daily for 7 days then stop.  Lialda 1.2 g daily in the a.m.  Rowasa enema 4 g daily at bedtime for 14 days.   3. ? Gastritis-patient will be discharged on Protonix 40 mg p.o. twice daily for 4  weeks.  3. Moderate malnutrition-albumin 2.5, likely from poor p.o. intake from ulcerative colitis.  Hopefully should improve after improvement of inflammation with IV steroids as well.    Procedures:  None  Consultations:  Gastroenterology  Discharge Exam: Vitals:   11/17/18 2035 11/17/18 2335  BP: (!) 97/48 (!) 98/52  Pulse: (!) 55 (!) 57  Resp: 16 16  Temp: 98.2 F (36.8 C) 98.6 F (37 C)  SpO2: 98% 99%    General-appears in no acute distress Heart-S1-S2, regular, no murmur auscultated Lungs-clear to auscultation bilaterally, no wheezing or crackles auscultated Abdomen-soft, nontender, no organomegaly Extremities-no edema in the lower extremities Neuro-alert, oriented x3, no focal deficit noted  Discharge Instructions   Discharge Instructions    Diet - low sodium heart healthy   Complete by: As directed    Increase activity slowly   Complete by: As directed      Allergies as of 11/18/2018      Reactions   Amoxicillin Rash, Hives      Medication List    TAKE these medications   FISH OIL PO Take 1 tablet by mouth daily.   gabapentin 100 MG capsule Commonly known as: NEURONTIN Take 100 mg by mouth 3 (three) times daily as needed (for pain).   mesalamine 4 g enema Commonly known as: ROWASA Place 60 mLs (4 g total) rectally at bedtime for 14 days.   mesalamine 1.2 g EC tablet Commonly known as: LIALDA  Take 4 tablets (4.8 g total) by mouth daily with breakfast for 30 days. Start taking on: November 19, 2018   norethindrone 0.35 MG tablet Commonly known as: MICRONOR Take 1 tablet by mouth daily.   ondansetron 4 MG disintegrating tablet Commonly known as: Zofran ODT Take 1 tablet (4 mg total) by mouth every 8 (eight) hours as needed for nausea or vomiting.   Orencia ClickJect 125 MG/ML Soaj Generic drug: Abatacept Take 125 mg by mouth every Wednesday.   pantoprazole 40 MG tablet Commonly known as: PROTONIX Take 1 tablet (40 mg total) by mouth  2 (two) times daily before a meal for 28 days.   predniSONE 10 MG tablet Commonly known as: DELTASONE Prednisone 40 mg po daily x 7 days then Prednisone 30 mg po daily x 7 days then Prednisone 20 mg po daily x 7 days then Prednisone 10 mg daily x 7 days then stop... What changed:   how much to take  how to take this  when to take this  additional instructions   PROBIOTIC PO Take 1 tablet by mouth 2 (two) times a day.   traMADol 50 MG tablet Commonly known as: ULTRAM Take 50 mg by mouth 3 (three) times daily as needed for moderate pain.      Allergies  Allergen Reactions  . Amoxicillin Rash and Hives      The results of significant diagnostics from this hospitalization (including imaging, microbiology, ancillary and laboratory) are listed below for reference.    Significant Diagnostic Studies: No results found.  Microbiology: Recent Results (from the past 240 hour(s))  SARS Coronavirus 2     Status: None   Collection Time: 11/11/18  3:55 PM  Result Value Ref Range Status   SARS Coronavirus 2 NOT DETECTED NOT DETECTED Final    Comment: (NOTE) SARS-CoV-2 target nucleic acids are NOT DETECTED. The SARS-CoV-2 RNA is generally detectable in upper and lower respiratory specimens during the acute phase of infection.  Negative  results do not preclude SARS-CoV-2 infection, do not rule out co-infections with other pathogens, and should not be used as the sole basis for treatment or other patient management decisions.  Negative results must be combined with clinical observations, patient history, and epidemiological information. The expected result is Not Detected. Fact Sheet for Patients: http://www.biofiredefense.com/wp-content/uploads/2020/03/BIOFIRE-COVID -19-patients.pdf Fact Sheet for Healthcare Providers: http://www.biofiredefense.com/wp-content/uploads/2020/03/BIOFIRE-COVID -19-hcp.pdf This test is not yet approved or cleared by the [place] FDA and   has been authorized for detection and/or diagnosis of SARS-CoV-2 by FDA under an Emergency Use Authorization (EUA).  This EUA will remain in effec t (meaning this test can be used) for the duration of  the COVID-19 declaration under Section 564(b)(1) of the Act, 21 U.S.C. section 360bbb-3(b)(1), unless the authorization is terminated or revoked sooner. Performed at [facility], [address] [place], [place] [address]   C difficile quick scan w PCR reflex     Status: None   Collection Time: 11/13/18  4:26 AM   Specimen: STOOL  Result Value Ref Range Status   C Diff antigen NEGATIVE NEGATIVE Final   C Diff toxin NEGATIVE NEGATIVE Final   C Diff interpretation No C. difficile detected.  Final    Comment: Performed at [facility], [address] [place], [place] [address]     Labs: Basic Metabolic Panel: Recent Labs  Lab 11/11/18 1432 11/11/18 1519 11/12/18 0308 11/13/18 0334 11/15/18 0304 11/17/18 0431  NA 136  --  137 138 138  139  K 4.0  --  4.0 5.1 4.0 4.2  CL 106  --  105 108 106 107  CO2 21*  --  24 21* 23 24  GLUCOSE 164*  --  140* 141* 97 178*  BUN 8  --  7 12 7 12   CREATININE 1.16*  --  1.00 1.03* 1.04* 1.06*  CALCIUM 8.0*  --  8.2* 8.3* 8.4* 8.4*  MG  --  2.2  --   --   --   --    Liver Function Tests: Recent Labs  Lab 11/11/18 1432 11/13/18 0334  AST 15 19  ALT 22 20  ALKPHOS 50 50  BILITOT 0.4 0.5  PROT 5.7* 6.0*  ALBUMIN 2.5* 2.6*   No results for input(s): LIPASE, AMYLASE in the last 168 hours. No results for input(s): AMMONIA in the last 168 hours. CBC: Recent Labs  Lab 11/11/18 1432  11/13/18 0334 11/14/18 0448 11/15/18 0304 11/16/18 0331 11/17/18 0431  WBC 5.8   < > 7.7 8.3 8.1 7.5 9.4  NEUTROABS 4.1  --   --   --   --   --   --   HGB 8.6*   < > 10.2* 10.5* 9.6* 10.4* 9.3*  HCT 26.6*   < > 31.8* 33.3* 30.5* 31.8* 29.7*  MCV 89.3   < > 89.1 89.3 88.9 88.3 90.3  PLT 344   < > 385 354 313 335 308   < > = values in this  interval not displayed.     Signed:  Oswald Hillock MD.  [facility] 11/18/2018, 9:07 AM

## 2018-11-18 NOTE — Progress Notes (Signed)
Nsg Discharge Note  Admit Date:  11/11/2018 Discharge date: 11/18/2018   Connie Craig to be D/C'd Home per MD order.  AVS completed.  Copy for chart, and copy for patient signed, and dated. Patient/caregiver able to verbalize understanding.  Discharge Medication: Allergies as of 11/18/2018      Reactions   Amoxicillin Rash, Hives      Medication List    TAKE these medications   FISH OIL PO Take 1 tablet by mouth daily. Notes to patient: 11/19/2018   gabapentin 100 MG capsule Commonly known as: NEURONTIN Take 100 mg by mouth 3 (three) times daily as needed (for pain).   mesalamine 4 g enema Commonly known as: ROWASA Place 60 mLs (4 g total) rectally at bedtime for 14 days. Notes to patient: 11/18/2018 bedtime   mesalamine 1.2 g EC tablet Commonly known as: LIALDA Take 4 tablets (4.8 g total) by mouth daily with breakfast for 30 days. Start taking on: November 19, 2018 Notes to patient: 11/19/2018   norethindrone 0.35 MG tablet Commonly known as: MICRONOR Take 1 tablet by mouth daily. Notes to patient: 11/19/2018   ondansetron 4 MG disintegrating tablet Commonly known as: Zofran ODT Take 1 tablet (4 mg total) by mouth every 8 (eight) hours as needed for nausea or vomiting.   Orencia ClickJect 628 MG/ML Soaj Generic drug: Abatacept Take 125 mg by mouth every Wednesday.   pantoprazole 40 MG tablet Commonly known as: PROTONIX Take 1 tablet (40 mg total) by mouth 2 (two) times daily before a meal for 28 days. Notes to patient: 11/18/2018 evening   predniSONE 10 MG tablet Commonly known as: DELTASONE Prednisone 40 mg po daily x 7 days then Prednisone 30 mg po daily x 7 days then Prednisone 20 mg po daily x 7 days then Prednisone 10 mg daily x 7 days then stop... What changed:   how much to take  how to take this  when to take this  additional instructions Notes to patient: 11/19/2018   PROBIOTIC PO Take 1 tablet by mouth 2 (two) times a day. Notes to patient:  11/18/2018 evening   traMADol 50 MG tablet Commonly known as: ULTRAM Take 50 mg by mouth 3 (three) times daily as needed for moderate pain.       Discharge Assessment: Vitals:   11/17/18 2035 11/17/18 2335  BP: (!) 97/48 (!) 98/52  Pulse: (!) 55 (!) 57  Resp: 16 16  Temp: 98.2 F (36.8 C) 98.6 F (37 C)  SpO2: 98% 99%   Skin clean, dry and intact without evidence of skin break down, no evidence of skin tears noted. IV catheter discontinued intact. Site without signs and symptoms of complications - no redness or edema noted at insertion site, patient denies c/o pain - only slight tenderness at site.  Dressing with slight pressure applied.  D/c Instructions-Education: Discharge instructions given to patient/family with verbalized understanding. D/c education completed with patient/family including follow up instructions, medication list, d/c activities limitations if indicated, with other d/c instructions as indicated by MD - patient able to verbalize understanding, all questions fully answered. Patient instructed to return to ED, call 911, or call MD for any changes in condition.  Patient escorted via [place], and D/C home via private auto.  Tresa Endo, RN 11/18/2018 9:54 AM

## 2018-11-20 LAB — HEPATITIS B SURFACE ANTIGEN: Hepatitis B Surface Ag: NEGATIVE

## 2019-10-12 ENCOUNTER — Other Ambulatory Visit: Payer: Self-pay | Admitting: Family Medicine

## 2019-10-12 DIAGNOSIS — Z1231 Encounter for screening mammogram for malignant neoplasm of breast: Secondary | ICD-10-CM

## 2021-05-10 ENCOUNTER — Other Ambulatory Visit: Payer: Self-pay

## 2021-05-10 ENCOUNTER — Other Ambulatory Visit: Payer: Self-pay | Admitting: Nurse Practitioner

## 2021-05-10 ENCOUNTER — Ambulatory Visit
Admission: RE | Admit: 2021-05-10 | Discharge: 2021-05-10 | Disposition: A | Discharge status: Home / Self Care | Payer: No Typology Code available for payment source | Source: Ambulatory Visit | Attending: Nurse Practitioner | Admitting: Nurse Practitioner

## 2021-05-10 DIAGNOSIS — Z1231 Encounter for screening mammogram for malignant neoplasm of breast: Secondary | ICD-10-CM

## 2021-11-30 ENCOUNTER — Other Ambulatory Visit: Payer: Self-pay

## 2022-04-16 ENCOUNTER — Other Ambulatory Visit: Payer: Self-pay | Admitting: Nurse Practitioner

## 2022-04-16 DIAGNOSIS — Z1231 Encounter for screening mammogram for malignant neoplasm of breast: Secondary | ICD-10-CM

## 2022-05-16 ENCOUNTER — Ambulatory Visit
Admission: RE | Admit: 2022-05-16 | Discharge: 2022-05-16 | Disposition: A | Discharge status: Home / Self Care | Payer: 59 | Source: Ambulatory Visit | Attending: Nurse Practitioner | Admitting: Nurse Practitioner

## 2022-05-16 DIAGNOSIS — Z1231 Encounter for screening mammogram for malignant neoplasm of breast: Secondary | ICD-10-CM

## 2022-09-11 ENCOUNTER — Other Ambulatory Visit (HOSPITAL_COMMUNITY): Payer: Self-pay | Admitting: Sports Medicine

## 2022-09-11 ENCOUNTER — Ambulatory Visit (HOSPITAL_COMMUNITY)
Admission: RE | Admit: 2022-09-11 | Discharge: 2022-09-11 | Disposition: A | Discharge status: Home / Self Care | Payer: 59 | Source: Ambulatory Visit | Attending: Cardiovascular Disease | Admitting: Cardiovascular Disease

## 2022-09-11 DIAGNOSIS — M7989 Other specified soft tissue disorders: Secondary | ICD-10-CM | POA: Insufficient documentation

## 2022-09-11 DIAGNOSIS — M79662 Pain in left lower leg: Secondary | ICD-10-CM | POA: Diagnosis present

## 2022-11-09 ENCOUNTER — Other Ambulatory Visit: Payer: Self-pay | Admitting: *Deleted

## 2022-11-09 ENCOUNTER — Telehealth: Payer: Self-pay

## 2022-11-09 DIAGNOSIS — R609 Edema, unspecified: Secondary | ICD-10-CM

## 2022-11-09 NOTE — Telephone Encounter (Signed)
LVM to r/s appt to 6/18 10:30 [place] and 11:20 [facility] new vein pt

## 2022-11-12 ENCOUNTER — Other Ambulatory Visit: Payer: Self-pay | Admitting: *Deleted

## 2022-11-12 DIAGNOSIS — R609 Edema, unspecified: Secondary | ICD-10-CM

## 2022-11-19 ENCOUNTER — Encounter: Payer: 59 | Admitting: Surgery

## 2022-11-19 ENCOUNTER — Encounter (HOSPITAL_COMMUNITY): Payer: 59

## 2022-11-19 ENCOUNTER — Ambulatory Visit (HOSPITAL_COMMUNITY)
Admission: RE | Admit: 2022-11-19 | Discharge: 2022-11-19 | Disposition: A | Discharge status: Home / Self Care | Payer: 59 | Source: Ambulatory Visit | Attending: Surgery | Admitting: Surgery

## 2022-11-19 DIAGNOSIS — R609 Edema, unspecified: Secondary | ICD-10-CM | POA: Insufficient documentation

## 2022-11-29 ENCOUNTER — Encounter (HOSPITAL_COMMUNITY): Payer: 59

## 2022-12-03 ENCOUNTER — Encounter: Payer: Self-pay | Admitting: Physician Assistant

## 2022-12-03 ENCOUNTER — Ambulatory Visit (INDEPENDENT_AMBULATORY_CARE_PROVIDER_SITE_OTHER): Payer: 59 | Admitting: Physician Assistant

## 2022-12-03 VITALS — BP 134/65 | HR 67 | Temp 98.4°F | Resp 18 | Ht 63.0 in | Wt 225.0 lb

## 2022-12-03 DIAGNOSIS — L818 Other specified disorders of pigmentation: Secondary | ICD-10-CM | POA: Diagnosis not present

## 2022-12-03 DIAGNOSIS — I872 Venous insufficiency (chronic) (peripheral): Secondary | ICD-10-CM

## 2022-12-03 DIAGNOSIS — M7989 Other specified soft tissue disorders: Secondary | ICD-10-CM

## 2022-12-03 NOTE — Progress Notes (Signed)
Requested by:  Delfin Gant, MD [address] [place],  [place] [address]  Reason for consultation: lower extremity swelling    History of Present Illness   Connie Craig is a 57 y.o. (13-Nov-1965) female who presents for evaluation of bilateral lower extremity swelling.  She endorses bilateral leg swelling for at least 5 to 6 years, fluctuating in severity.  Her left leg is usually worse than her right.  In the morning her legs are not swollen, but by the end of the day her calves and ankles are fairly swollen.  This causes her legs to feel achy, heavy, and sensitive to touch.  She has to sit a lot for her job and has noticed that prolonged sitting makes her legs more swollen.  She has noticed venous discoloration of her lower legs for few years now.  She has not tried compression before.  She does elevate her legs above her heart, and this helps with her leg swelling.  She denies any previous vein procedures or history of DVT.   Past Medical History:  Diagnosis Date   Arthritis    Asthma    Obesity    Pneumonia     Past Surgical History:  Procedure Laterality Date   JOINT REPLACEMENT     TONSILLECTOMY      Social History   Socioeconomic History   Marital status: Single    Spouse name: Not on file   Number of children: Not on file   Years of education: Not on file   Highest education level: Not on file  Occupational History   Occupation: CNA   Tobacco Use   Smoking status: Former    Packs/day: 1.00    Years: 30.00    Additional pack years: 0.00    Total pack years: 30.00    Types: Cigarettes    Quit date: 03/22/2011    Years since quitting: 11.7   Smokeless tobacco: Never  Vaping Use   Vaping Use: Never used  Substance and Sexual Activity   Alcohol use: No    Alcohol/week: 0.0 standard drinks of alcohol   Drug use: No   Sexual activity: Not on file  Other Topics Concern   Not on file  Social History Narrative   Not on file   Social  Determinants of Health   Financial Resource Strain: Not on file  Food Insecurity: Not on file  Transportation Needs: Not on file  Physical Activity: Not on file  Stress: Not on file  Social Connections: Not on file  Intimate Partner Violence: Not on file    Family History  Problem Relation Age of Onset   Allergies Mother    Asthma Mother    Rheum arthritis Mother    Vascular Disease Mother    Heart disease Father     Current Outpatient Medications  Medication Sig Dispense Refill   gabapentin (NEURONTIN) 100 MG capsule Take 100 mg by mouth 3 (three) times daily as needed (for pain).      mesalamine (LIALDA) 1.2 g EC tablet Take 4 tablets (4.8 g total) by mouth daily with breakfast for 30 days. 120 tablet 3   mesalamine (ROWASA) 4 g enema Place 60 mLs (4 g total) rectally at bedtime for 14 days. 840 mL 0   norethindrone (MICRONOR) 0.35 MG tablet Take 1 tablet by mouth daily.     Omega-3 Fatty Acids (FISH OIL PO) Take 1 tablet by mouth daily.     ondansetron (  ZOFRAN ODT) 4 MG disintegrating tablet Take 1 tablet (4 mg total) by mouth every 8 (eight) hours as needed for nausea or vomiting. 20 tablet 0   ORENCIA CLICKJECT 125 MG/ML SOAJ Take 125 mg by mouth every Wednesday.     pantoprazole (PROTONIX) 40 MG tablet Take 1 tablet (40 mg total) by mouth 2 (two) times daily before a meal for 28 days. 56 tablet 0   predniSONE (DELTASONE) 10 MG tablet Prednisone 40 mg po daily x 7 days then Prednisone 30 mg po daily x 7 days then Prednisone 20 mg po daily x 7 days then Prednisone 10 mg daily x 7 days then stop... 70 tablet 0   Probiotic Product (PROBIOTIC PO) Take 1 tablet by mouth 2 (two) times a day.     traMADol (ULTRAM) 50 MG tablet Take 50 mg by mouth 3 (three) times daily as needed for moderate pain.      No current facility-administered medications for this visit.    Allergies  Allergen Reactions   Amoxicillin Rash and Hives    REVIEW OF SYSTEMS (negative unless checked):    Cardiac:  []  Chest pain or chest pressure? []  Shortness of breath upon activity? []  Shortness of breath when lying flat? []  Irregular heart rhythm?  Vascular:  []  Pain in calf, thigh, or hip brought on by walking? []  Pain in feet at night that wakes you up from your sleep? []  Blood clot in your veins? [x]  Leg swelling?  Pulmonary:  []  Oxygen at home? []  Productive cough? []  Wheezing?  Neurologic:  []  Sudden weakness in arms or legs? []  Sudden numbness in arms or legs? []  Sudden onset of difficult speaking or slurred speech? []  Temporary loss of vision in one eye? []  Problems with dizziness?  Gastrointestinal:  []  Blood in stool? []  Vomited blood?  Genitourinary:  []  Burning when urinating? []  Blood in urine?  Psychiatric:  []  Major depression  Hematologic:  []  Bleeding problems? []  Problems with blood clotting?  Dermatologic:  []  Rashes or ulcers?  Constitutional:  []  Fever or chills?  Ear/Nose/Throat:  []  Change in hearing? []  Nose bleeds? []  Sore throat?  Musculoskeletal:  []  Back pain? []  Joint pain? []  Muscle pain?   Physical Examination    There were no vitals filed for this visit. There is no height or weight on file to calculate BMI.  General:  WDWN in NAD; vital signs documented above Gait: Not observed HENT: WNL, normocephalic Pulmonary: normal non-labored breathing , without Rales, rhonchi,  wheezing Cardiac: regular Abdomen: soft, NT, no masses Skin: without rashes Vascular Exam/Pulses: palpable pedal pulses Extremities: Trace edema of bilateral lower extremities.  Stasis pigmentation of bilateral ankles.  No ulcerations Musculoskeletal: no muscle wasting or atrophy  Neurologic: A&O X 3;  No focal weakness or paresthesias are detected Psychiatric:  The pt has Normal affect.  Non-invasive Vascular Imaging   LLE Venous Insufficiency Duplex (11/19/2022):  +--------------+---------+------+-----------+------------+--------+  LEFT          Reflux NoRefluxReflux TimeDiameter cmsComments                          Yes                                   +--------------+---------+------+-----------+------------+--------+  CFV          no                                              +--------------+---------+------+-----------+------------+--------+  FV prox       no                                              +--------------+---------+------+-----------+------------+--------+  FV mid        no                                              +--------------+---------+------+-----------+------------+--------+  FV dist       no                                              +--------------+---------+------+-----------+------------+--------+  Popliteal    no                                              +--------------+---------+------+-----------+------------+--------+  GSV at [facility]    no                            0.70              +--------------+---------+------+-----------+------------+--------+  GSV prox thighno                            0.59              +--------------+---------+------+-----------+------------+--------+  GSV mid thigh no                            0.38              +--------------+---------+------+-----------+------------+--------+  GSV dist thigh          yes    >500 ms      0.38              +--------------+---------+------+-----------+------------+--------+  GSV at knee   no                            0.43              +--------------+---------+------+-----------+------------+--------+  GSV prox calf           yes    >500 ms      0.30              +--------------+---------+------+-----------+------------+--------+  SSV Pop Fossa no                            0.21              +--------------+---------+------+-----------+------------+--------+  SSV prox calf no                            0.24               +--------------+---------+------+-----------+------------+--------+    Medical Decision Making   Connie Craig is a 57  y.o. female who presents for left lower extremity swelling  Based on the patient's duplex study, there is reflux in the left greater saphenous vein at the distal thigh and proximal calf.  The remainder of the deep and superficial venous system is competent.  The patient is not a candidate for greater saphenous vein ablation She has been experiencing bilateral lower extremity swelling, left greater than right, for multiple years now.  This causes her legs to feel sensitive and achy.  She benefits from leg elevation, but has not tried compression before On exam she has trace bilateral lower extremity edema.  She does have mild stasis pigmentation of her ankles. I have recommended the daily use of compression stockings and elevating her legs as needed to help with lower extremity swelling.  She was fitted for knee-high and thigh-high compression stockings today.  She is going to try both to see which ones she benefits from more She can follow-up with our office as needed    Ernestene Mention, PA-C [facility] Office: [phone]  12/03/2022, 8:30 AM  Clinic MD: Myra Gianotti

## 2023-12-26 ENCOUNTER — Other Ambulatory Visit: Payer: Self-pay | Admitting: Family Medicine

## 2023-12-26 DIAGNOSIS — Z1231 Encounter for screening mammogram for malignant neoplasm of breast: Secondary | ICD-10-CM

## 2024-01-29 ENCOUNTER — Ambulatory Visit
Admission: RE | Admit: 2024-01-29 | Discharge: 2024-01-29 | Disposition: A | Discharge status: Home / Self Care | Source: Ambulatory Visit | Attending: Family Medicine | Admitting: Family Medicine

## 2024-01-29 DIAGNOSIS — Z1231 Encounter for screening mammogram for malignant neoplasm of breast: Secondary | ICD-10-CM

## 2024-02-05 ENCOUNTER — Other Ambulatory Visit: Payer: Self-pay | Admitting: Family Medicine

## 2024-02-05 DIAGNOSIS — R928 Other abnormal and inconclusive findings on diagnostic imaging of breast: Secondary | ICD-10-CM
